# Patient Record
Sex: Female | Born: 1965
Health system: Southern US, Community
[De-identification: ages and names within clinical notes are randomized; demographics above are authoritative.]

## PROBLEM LIST (undated history)

## (undated) DIAGNOSIS — Z8719 Personal history of other diseases of the digestive system: Secondary | ICD-10-CM

## (undated) DIAGNOSIS — T7840XA Allergy, unspecified, initial encounter: Secondary | ICD-10-CM

## (undated) DIAGNOSIS — R51 Headache: Secondary | ICD-10-CM

## (undated) DIAGNOSIS — T4145XA Adverse effect of unspecified anesthetic, initial encounter: Secondary | ICD-10-CM

## (undated) DIAGNOSIS — T753XXA Motion sickness, initial encounter: Secondary | ICD-10-CM

## (undated) DIAGNOSIS — K219 Gastro-esophageal reflux disease without esophagitis: Secondary | ICD-10-CM

## (undated) DIAGNOSIS — J302 Other seasonal allergic rhinitis: Secondary | ICD-10-CM

## (undated) DIAGNOSIS — T8859XA Other complications of anesthesia, initial encounter: Secondary | ICD-10-CM

## (undated) DIAGNOSIS — M1990 Unspecified osteoarthritis, unspecified site: Secondary | ICD-10-CM

## (undated) DIAGNOSIS — Z973 Presence of spectacles and contact lenses: Secondary | ICD-10-CM

## (undated) DIAGNOSIS — R519 Headache, unspecified: Secondary | ICD-10-CM

## (undated) HISTORY — PX: WRIST SURGERY: SHX841

## (undated) HISTORY — PX: HARDWARE REMOVAL: SHX979

## (undated) HISTORY — PX: TONSILLECTOMY: SUR1361

## (undated) HISTORY — PX: TOTAL ABDOMINAL HYSTERECTOMY W/ BILATERAL SALPINGOOPHORECTOMY: SHX83

## (undated) HISTORY — DX: Allergy, unspecified, initial encounter: T78.40XA

## (undated) HISTORY — PX: APPENDECTOMY: SHX54

---
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
## 2000-04-01 HISTORY — PX: WRIST GANGLION EXCISION: SHX840

## 2003-04-02 HISTORY — PX: TOTAL ABDOMINAL HYSTERECTOMY W/ BILATERAL SALPINGOOPHORECTOMY: SHX83

## 2003-04-02 HISTORY — PX: ABDOMINAL HYSTERECTOMY: SHX81

## 2004-04-25 ENCOUNTER — Ambulatory Visit: Payer: Self-pay | Admitting: Obstetrics and Gynecology

## 2004-04-27 ENCOUNTER — Ambulatory Visit: Payer: Self-pay | Admitting: Obstetrics and Gynecology

## 2004-06-05 ENCOUNTER — Ambulatory Visit: Payer: Self-pay | Admitting: Obstetrics and Gynecology

## 2004-06-07 ENCOUNTER — Ambulatory Visit: Payer: Self-pay | Admitting: Obstetrics and Gynecology

## 2004-06-29 ENCOUNTER — Ambulatory Visit: Payer: Self-pay | Admitting: Obstetrics and Gynecology

## 2005-04-01 HISTORY — PX: FRACTURE SURGERY: SHX138

## 2005-07-16 ENCOUNTER — Ambulatory Visit: Payer: Self-pay | Admitting: Obstetrics and Gynecology

## 2005-08-07 ENCOUNTER — Ambulatory Visit: Payer: Self-pay | Admitting: Obstetrics and Gynecology

## 2005-08-30 ENCOUNTER — Ambulatory Visit: Payer: Self-pay | Admitting: Obstetrics and Gynecology

## 2005-11-09 ENCOUNTER — Inpatient Hospital Stay: Payer: Self-pay | Admitting: Specialist

## 2005-11-09 ENCOUNTER — Other Ambulatory Visit: Payer: Self-pay

## 2006-07-30 ENCOUNTER — Ambulatory Visit: Payer: Self-pay | Admitting: Family Medicine

## 2007-10-08 ENCOUNTER — Ambulatory Visit: Payer: Self-pay

## 2007-10-28 ENCOUNTER — Ambulatory Visit: Payer: Self-pay | Admitting: Family Medicine

## 2007-10-28 DIAGNOSIS — J309 Allergic rhinitis, unspecified: Secondary | ICD-10-CM | POA: Insufficient documentation

## 2007-10-28 DIAGNOSIS — Z7289 Other problems related to lifestyle: Secondary | ICD-10-CM | POA: Insufficient documentation

## 2007-10-28 DIAGNOSIS — F4320 Adjustment disorder, unspecified: Secondary | ICD-10-CM | POA: Insufficient documentation

## 2007-10-28 DIAGNOSIS — Z789 Other specified health status: Secondary | ICD-10-CM | POA: Insufficient documentation

## 2009-02-09 ENCOUNTER — Ambulatory Visit: Payer: Self-pay | Admitting: Family Medicine

## 2009-09-26 DIAGNOSIS — R030 Elevated blood-pressure reading, without diagnosis of hypertension: Secondary | ICD-10-CM | POA: Insufficient documentation

## 2010-02-27 ENCOUNTER — Ambulatory Visit: Payer: Self-pay

## 2011-03-28 ENCOUNTER — Ambulatory Visit: Payer: Self-pay | Admitting: General Practice

## 2011-04-01 ENCOUNTER — Ambulatory Visit: Payer: Self-pay | Admitting: General Practice

## 2012-04-14 ENCOUNTER — Ambulatory Visit: Payer: Self-pay | Admitting: Family Medicine

## 2012-09-01 ENCOUNTER — Ambulatory Visit: Payer: Self-pay | Admitting: Internal Medicine

## 2014-01-26 ENCOUNTER — Ambulatory Visit: Payer: Self-pay | Admitting: Family Medicine

## 2014-01-26 LAB — HM PAP SMEAR: HM Pap smear: NEGATIVE

## 2014-01-26 LAB — HM MAMMOGRAPHY

## 2014-11-07 ENCOUNTER — Ambulatory Visit: Payer: Self-pay | Admitting: Podiatry

## 2014-11-14 ENCOUNTER — Other Ambulatory Visit: Payer: Self-pay | Admitting: Family Medicine

## 2014-11-14 NOTE — Telephone Encounter (Signed)
This a pt of Dr. Sharyon Medicus, can you please refill this prescription?  Last ov was on 01/26/2014  Thanks,

## 2014-11-15 ENCOUNTER — Encounter: Payer: Self-pay | Admitting: Podiatry

## 2014-11-15 ENCOUNTER — Ambulatory Visit: Payer: Self-pay

## 2014-11-15 ENCOUNTER — Ambulatory Visit (INDEPENDENT_AMBULATORY_CARE_PROVIDER_SITE_OTHER): Payer: BLUE CROSS/BLUE SHIELD | Admitting: Podiatry

## 2014-11-15 VITALS — BP 126/79 | HR 74 | Resp 18

## 2014-11-15 DIAGNOSIS — M719 Bursopathy, unspecified: Secondary | ICD-10-CM

## 2014-11-15 DIAGNOSIS — M2010 Hallux valgus (acquired), unspecified foot: Secondary | ICD-10-CM | POA: Diagnosis not present

## 2014-11-15 DIAGNOSIS — M722 Plantar fascial fibromatosis: Secondary | ICD-10-CM | POA: Diagnosis not present

## 2014-11-15 DIAGNOSIS — M79673 Pain in unspecified foot: Secondary | ICD-10-CM

## 2014-11-15 NOTE — Progress Notes (Signed)
   Subjective:    Patient ID: Dana Jarvis, female    DOB: 1965/06/24, 49 y.o.   MRN: [id number]  HPI 49 year old female presents the office today with multiple concerns. She states that she has pain to her left foot between her second and third toes with his been ongoing for approximate 2 years. She states that she has majority the pain after prolonged ambulation and weightbearing. Denies any history of injury or trauma. Denies any swelling or redness. No tingling or numbness. She said no prior treatment for this.  Secondly she has concerns of small bunions to both of her feet which is not painful. She's had no treatment for this. She has not noticed them grow recently.  Lastly she has firm masses on the bottom of both of her feet which are not painful and ongoing for several years. The only time she had any discomfort is other prolonged weightbearing or ambulation. Present no prior treatment for this.  No other complaints this time.   Review of Systems  HENT:       Sinus problems   All other systems reviewed and are negative.      Objective:   Physical Exam AAO x3, NAD DP/PT pulses palpable bilaterally, CRT less than 3 seconds Protective sensation intact with Simms Weinstein monofilament, vibratory sensation intact, Achilles tendon reflex intact There is tenderness palpation the second interspace of the left foot between the second and third metatarsals. There is no palpable neuroma identified. There is no area pinpoint bony tenderness or pain the vibratory sensation. There is noted tenderness to the toes. Mild HAV is present bilaterally without any pain over the bunion or pain with MTPJ range of motion. There is no crepitation. No overlying edema, erythema, increase in warmth. There is small firm nodules present within the medial band of the plantar fascia to bilateral feet within the arch of the foot. There is no tenderness palpation overlying the area and there is no overlying skin  change. No other areas of tenderness to bilateral lower extremities. MMT 5/5, ROM WNL.  No open lesions or pre-ulcerative lesions.  No overlying edema, erythema, increase in warmth to bilateral lower extremities.  No pain with calf compression, swelling, warmth, erythema bilaterally.      Assessment & Plan:  49 year old female with bilateral plantar fibromas, is mild HAV both of which are not symptomatic; left second interspace pain. -X-rays were obtained and reviewed with the patient.  -Treatment options discussed including all alternatives, risks, and complications -Discussed both conservative and surgical treatment options for both the bunion in the plantar fibromas. This time she likes to hold off on any treatment and observe the area. I do with it a be good for her to have some kind of orthotic to help of these issues. I discussed this with her. -Discussed likely etiologies the second interspace pain. The bursitis/tendinitis. Neuroma unlikely. I discussed steroid injectionto the interspace on the area of maximal tenderness. I discussed risks and, occasions which understands and consents. Under sterile conditions a total of 2 mL of a mixture of Kenalog 10, 0.5% Marcaine plain and 2% lidocaine plain was infiltrated into the left second interspace but any complications. Post injection care was discussed with the patient. -Follow-up as scheduled or sooner if any problems arise. In the meantime, encouraged to call the office with any questions, concerns, change in symptoms.   Celesta Gentile, DPM

## 2014-12-13 ENCOUNTER — Ambulatory Visit (INDEPENDENT_AMBULATORY_CARE_PROVIDER_SITE_OTHER): Payer: BLUE CROSS/BLUE SHIELD | Admitting: Podiatry

## 2014-12-13 ENCOUNTER — Encounter: Payer: Self-pay | Admitting: Podiatry

## 2014-12-13 VITALS — BP 147/89 | HR 69 | Resp 18

## 2014-12-13 DIAGNOSIS — M719 Bursopathy, unspecified: Secondary | ICD-10-CM | POA: Diagnosis not present

## 2014-12-13 DIAGNOSIS — M2010 Hallux valgus (acquired), unspecified foot: Secondary | ICD-10-CM

## 2014-12-13 DIAGNOSIS — D3610 Benign neoplasm of peripheral nerves and autonomic nervous system, unspecified: Secondary | ICD-10-CM

## 2014-12-14 NOTE — Progress Notes (Signed)
Patient ID: Dana Jarvis, female   DOB: 15-Sep-1965, 49 y.o.   MRN: [id number]  Subjective: 49 year old female presents the office today for follow-up evaluation of left foot second interspace pain. She states the injection did help quite a bit and she has stopped wearing flat shoes which seem to help as well. She has continued some discomfort to the area as well as some intermittent numbness to the toes through the first, second, third toes. Denies any swelling or redness. No other complaints at this time. No acute changes.  Objective: AAO x3, NAD DP/PT pulses palpable bilaterally, CRT less than 3 seconds Protective sensation intact with Simms Weinstein monofilament There is mild discomfort upon palpation to the left second interspace between the metatarsal heads. There is no palpable neuroma identified at this time. There is mild reproduction of symptoms on the medial to lateral compression of the metatarsals. There is no pinpoint bony tenderness or pain the vibratory sensation. No rebound edema, erythema, increase in warmth. No change in the plantar soft tissue masses. Mild HAV. No other areas of tenderness to bilateral lower extremities. MMT 5/5, ROM WNL.  No open lesions or pre-ulcerative lesions.  No overlying edema, erythema, increase in warmth to bilateral lower extremities.  No pain with calf compression, swelling, warmth, erythema bilaterally.   Assessment: 49 year old female left second interspace pain, bursitis versus possible neuroma  Plan: -Treatment options discussed including all alternatives, risks, and complications -Discussed steroid injection has not seem to help his symptoms persist somewhat. Risks and complications discussed for which she understands verbally consents. Under sterile conditions a total of 1.5 mL of a mixture of Kenalog 10, 0.5% Marcaine plain and 2% lidocaine plain was infiltrated into the area of maximal tenderness in the left second interspace without  competitions. She tolerated this well without competitions. Postinjection care was discussed. -Dispensed metatarsal offloading pads. -Discussed orthotics. -Follow-up in 6 weeks if symptoms continue or sooner if any problems arise. In the meantime, encouraged to call the office with any questions, concerns, change in symptoms.   Celesta Gentile, DPM

## 2015-01-16 ENCOUNTER — Other Ambulatory Visit: Payer: Self-pay | Admitting: Family Medicine

## 2015-01-16 DIAGNOSIS — F4322 Adjustment disorder with anxiety: Secondary | ICD-10-CM

## 2015-01-17 DIAGNOSIS — E0590 Thyrotoxicosis, unspecified without thyrotoxic crisis or storm: Secondary | ICD-10-CM | POA: Insufficient documentation

## 2015-01-17 DIAGNOSIS — R0683 Snoring: Secondary | ICD-10-CM | POA: Insufficient documentation

## 2015-01-20 ENCOUNTER — Encounter: Payer: Self-pay | Admitting: Family Medicine

## 2015-01-24 ENCOUNTER — Ambulatory Visit: Payer: BLUE CROSS/BLUE SHIELD | Admitting: Podiatry

## 2015-04-05 DIAGNOSIS — M1990 Unspecified osteoarthritis, unspecified site: Secondary | ICD-10-CM | POA: Insufficient documentation

## 2015-04-05 DIAGNOSIS — G4700 Insomnia, unspecified: Secondary | ICD-10-CM | POA: Insufficient documentation

## 2015-04-05 DIAGNOSIS — E669 Obesity, unspecified: Secondary | ICD-10-CM | POA: Insufficient documentation

## 2015-04-05 DIAGNOSIS — R319 Hematuria, unspecified: Secondary | ICD-10-CM | POA: Insufficient documentation

## 2015-04-05 DIAGNOSIS — R9431 Abnormal electrocardiogram [ECG] [EKG]: Secondary | ICD-10-CM | POA: Insufficient documentation

## 2015-04-05 DIAGNOSIS — K219 Gastro-esophageal reflux disease without esophagitis: Secondary | ICD-10-CM | POA: Insufficient documentation

## 2015-04-06 ENCOUNTER — Encounter: Payer: Self-pay | Admitting: Family Medicine

## 2015-04-06 ENCOUNTER — Ambulatory Visit (INDEPENDENT_AMBULATORY_CARE_PROVIDER_SITE_OTHER): Payer: BLUE CROSS/BLUE SHIELD | Admitting: Family Medicine

## 2015-04-06 VITALS — BP 132/84 | HR 74 | Temp 98.1°F | Resp 16 | Ht 62.0 in | Wt 204.0 lb

## 2015-04-06 DIAGNOSIS — Z1211 Encounter for screening for malignant neoplasm of colon: Secondary | ICD-10-CM

## 2015-04-06 DIAGNOSIS — Z0000 Encounter for general adult medical examination without abnormal findings: Secondary | ICD-10-CM | POA: Diagnosis not present

## 2015-04-06 DIAGNOSIS — Z1239 Encounter for other screening for malignant neoplasm of breast: Secondary | ICD-10-CM

## 2015-04-06 LAB — POCT URINALYSIS DIPSTICK
Bilirubin, UA: NEGATIVE
Blood, UA: NEGATIVE
Glucose, UA: NEGATIVE
Ketones, UA: NEGATIVE
Leukocytes, UA: NEGATIVE
Nitrite, UA: NEGATIVE
Protein, UA: NEGATIVE
Spec Grav, UA: 1.01
Urobilinogen, UA: 0.2
pH, UA: 6

## 2015-04-06 NOTE — Progress Notes (Signed)
Patient ID: ERIOLUWA VENTURINO, female   DOB: 1965-08-05, 50 y.o.   MRN: [id number]       Patient: Dana Jarvis, Female    DOB: [birth year], 50 y.o.   MRN: [id number] Visit Date: 04/06/2015  Today's Provider: Margarita Rana, MD   Chief Complaint  Patient presents with  . Annual Exam   Subjective:    Annual physical exam Dana Jarvis is a 50 y.o. female who presents today for health maintenance and complete physical. She feels well. She reports exercising none. She reports she is sleeping well.  01/26/14 CPE 01/26/14 Pap-neg; HPV-neg 01/26/14 Mammo-BI-RADS 1  Results for orders placed or performed in visit on 04/06/15  POCT urinalysis dipstick  Result Value Ref Range   Color, UA yellow    Clarity, UA clear    Glucose, UA neg    Bilirubin, UA neg    Ketones, UA neg    Spec Grav, UA 1.010    Blood, UA neg    pH, UA 6.0    Protein, UA neg    Urobilinogen, UA 0.2    Nitrite, UA neg    Leukocytes, UA Negative Negative    -----------------------------------------------------------------   Review of Systems  Constitutional: Negative.   HENT: Negative.   Eyes: Negative.   Respiratory: Negative.   Cardiovascular: Negative.   Gastrointestinal: Negative.   Endocrine: Negative.   Genitourinary: Negative.   Musculoskeletal: Negative.   Skin: Negative.   Allergic/Immunologic: Positive for environmental allergies.  Neurological: Negative.   Hematological: Negative.   Psychiatric/Behavioral: Negative.     Social History      She  reports that she has never smoked. She has never used smokeless tobacco. She reports that she drinks alcohol. She reports that she does not use illicit drugs.       Social History   Social History  . Marital Status: Widowed    Spouse Name: N/A  . Number of Children: N/A  . Years of Education: N/A   Social History Main Topics  . Smoking status: Never Smoker   . Smokeless tobacco: Never Used  . Alcohol Use: 0.0 oz/week    0 Standard drinks  or equivalent per week     Comment: occasional  . Drug Use: No  . Sexual Activity: Not Asked   Other Topics Concern  . None   Social History Narrative    History reviewed. No pertinent past medical history.   Patient Active Problem List   Diagnosis Date Noted  . Arthritis 04/05/2015  . Acid reflux 04/05/2015  . Blood in the urine 04/05/2015  . Cannot sleep 04/05/2015  . Adiposity 04/05/2015  . Shortened PR interval 04/05/2015  . Hyperthyroidism, subclinical 01/17/2015  . Snores 01/17/2015  . Blood pressure elevated without history of HTN 09/26/2009  . Adaptation reaction 10/28/2007  . Alcohol drinker ([place]) 10/28/2007  . Allergic rhinitis 10/28/2007    Past Surgical History  Procedure Laterality Date  . Wrist surgery Left     ganglion  . Tonsillectomy    . Fracture surgery  2007  . Abdominal hysterectomy  2005    with bilateral salpingo-oophorectomy  . Cesarean section  72    Family History        Family Status  Relation Status Death Age  . Mother Deceased 29  . Father Deceased 22  . Brother Alive   . Sister Alive         Her family history includes Arthritis in her mother; Cancer in her  father and mother; Glaucoma in her mother; Healthy in her sister; Hypertension in her brother.    Allergies  Allergen Reactions  . Latex   . Sulfa Antibiotics     Previous Medications   CETIRIZINE HCL (ZYRTEC ALLERGY) 10 MG CAPS    Take by mouth.   CHOLECALCIFEROL (D3 DOTS) 2000 UNITS TBDP    Take by mouth.   ESCITALOPRAM (LEXAPRO) 10 MG TABLET    TAKE ONE TABLET EVERY DAY   FLUTICASONE (FLONASE) 50 MCG/ACT NASAL SPRAY    Place 2 sprays into both nostrils as needed.     Patient Care Team: Margarita Rana, MD as PCP - General (Family Medicine)     Objective:   Vitals: BP 132/84 mmHg  Pulse 74  Temp(Src) 98.1 F (36.7 C) (Oral)  Resp 16  Wt 204 lb (92.534 kg)  SpO2 97%  LMP  (LMP Unknown)   Physical Exam  Constitutional: She is oriented to person, place,  and time. She appears well-developed and well-nourished.  HENT:  Head: Normocephalic and atraumatic.  Right Ear: Tympanic membrane, external ear and ear canal normal.  Left Ear: Tympanic membrane, external ear and ear canal normal.  Nose: Nose normal.  Mouth/Throat: Uvula is midline, oropharynx is clear and moist and mucous membranes are normal.  Eyes: Conjunctivae, EOM and lids are normal. Pupils are equal, round, and reactive to light.  Neck: Trachea normal and normal range of motion. Neck supple. Carotid bruit is not present. No thyroid mass and no thyromegaly present.  Cardiovascular: Normal rate, regular rhythm and normal heart sounds.   Pulmonary/Chest: Effort normal and breath sounds normal.  Abdominal: Soft. Normal appearance and bowel sounds are normal. There is no hepatosplenomegaly. There is no tenderness.  Genitourinary: No breast swelling, tenderness or discharge.  Musculoskeletal: Normal range of motion.  Lymphadenopathy:    She has no cervical adenopathy.    She has no axillary adenopathy.  Neurological: She is alert and oriented to person, place, and time. She has normal strength. No cranial nerve deficit.  Skin: Skin is warm, dry and intact.  Psychiatric: She has a normal mood and affect. Her speech is normal and behavior is normal. Judgment and thought content normal. Cognition and memory are normal.     Depression Screen PHQ 2/9 Scores 04/06/2015  PHQ - 2 Score 0      Assessment & Plan:     Routine Health Maintenance and Physical Exam  Exercise Activities and Dietary recommendations Goals    . Exercise 150 minutes per week (moderate activity)       1. Annual physical exam As above.   Stressed importance of eating healthy and exercise.  Recheck in 4 months.  - POCT urinalysis dipstick  2. Breast cancer screening Patient will schedule.   - MM DIGITAL SCREENING BILATERAL; Future  3. Colon cancer screening Will refer for colonoscopy.   - Ambulatory  referral to Gastroenterology   Patient was seen and examined by Jerrell Belfast, MD, and note scribed by Lynford Humphrey, [place].   I have reviewed the document for accuracy and completeness and I agree with above. Jerrell Belfast, MD   Margarita Rana, MD      --------------------------------------------------------------------

## 2015-04-07 ENCOUNTER — Other Ambulatory Visit: Payer: Self-pay

## 2015-04-07 ENCOUNTER — Telehealth: Payer: Self-pay

## 2015-04-07 ENCOUNTER — Telehealth: Payer: Self-pay | Admitting: Family Medicine

## 2015-04-07 NOTE — Telephone Encounter (Signed)
Pt called saying she was in yesterday for a CPE.  She works for [employer].  She thought she was suppose to get a lab order to have labs done there at [facility] but she did not get anything.  Please call her back and let her know what the plans are for labs.  Her call back is  [phone]

## 2015-04-07 NOTE — Telephone Encounter (Signed)
Labs normal last year. Do not routinely check yearly, but can write out rx for labs if patient would like or has a particular concern. Thanks.

## 2015-04-07 NOTE — Telephone Encounter (Signed)
Gastroenterology Pre-Procedure Review  Request Date: 04/28/15 Requesting Physician: Dr. Venia Minks  PATIENT REVIEW QUESTIONS: The patient responded to the following health history questions as indicated:    1. Are you having any GI issues? no 2. Do you have a personal history of Polyps? no 3. Do you have a family history of Colon Cancer or Polyps? no 4. Diabetes Mellitus? no 5. Joint replacements in the past 12 months?no 6. Major health problems in the past 3 months?no 7. Any artificial heart valves, MVP, or defibrillator?no    MEDICATIONS & ALLERGIES:    Patient reports the following regarding taking any anticoagulation/antiplatelet therapy:   Plavix, Coumadin, Eliquis, Xarelto, Lovenox, Pradaxa, Brilinta, or Effient? no Aspirin? no  Patient confirms/reports the following medications:  Current Outpatient Prescriptions  Medication Sig Dispense Refill  . Cetirizine HCl (ZYRTEC ALLERGY) 10 MG CAPS Take by mouth.    . Cholecalciferol (D3 DOTS) 2000 UNITS TBDP Take by mouth.    . escitalopram (LEXAPRO) 10 MG tablet TAKE ONE TABLET EVERY DAY 30 tablet 5  . fluticasone (FLONASE) 50 MCG/ACT nasal spray Place 2 sprays into both nostrils as needed.   2   No current facility-administered medications for this visit.    Patient confirms/reports the following allergies:  Allergies  Allergen Reactions  . Latex   . Sulfa Antibiotics     No orders of the defined types were placed in this encounter.    AUTHORIZATION INFORMATION Primary Insurance: 1D#: Group #:  Secondary Insurance: 1D#: Group #:  SCHEDULE INFORMATION: Date: 04/28/15 Time: Location: [place]

## 2015-04-07 NOTE — Telephone Encounter (Signed)
Pt advised as directed below.  She does not want to get her labs done at this time.    Thanks,   -Mickel Baas

## 2015-04-19 ENCOUNTER — Encounter: Payer: Self-pay | Admitting: *Deleted

## 2015-04-19 ENCOUNTER — Encounter: Payer: Self-pay | Admitting: Anesthesiology

## 2015-04-28 ENCOUNTER — Ambulatory Visit
Admission: RE | Admit: 2015-04-28 | Payer: BLUE CROSS/BLUE SHIELD | Source: Ambulatory Visit | Admitting: Gastroenterology

## 2015-04-28 ENCOUNTER — Encounter: Admission: RE | Payer: Self-pay | Source: Ambulatory Visit

## 2015-04-28 HISTORY — DX: Other complications of anesthesia, initial encounter: T88.59XA

## 2015-04-28 HISTORY — DX: Unspecified osteoarthritis, unspecified site: M19.90

## 2015-04-28 HISTORY — DX: Adverse effect of unspecified anesthetic, initial encounter: T41.45XA

## 2015-04-28 HISTORY — DX: Presence of spectacles and contact lenses: Z97.3

## 2015-04-28 HISTORY — DX: Gastro-esophageal reflux disease without esophagitis: K21.9

## 2015-04-28 HISTORY — DX: Other seasonal allergic rhinitis: J30.2

## 2015-04-28 HISTORY — DX: Motion sickness, initial encounter: T75.3XXA

## 2015-04-28 HISTORY — DX: Headache: R51

## 2015-04-28 HISTORY — DX: Headache, unspecified: R51.9

## 2015-04-28 SURGERY — COLONOSCOPY WITH PROPOFOL
Anesthesia: Choice

## 2015-08-11 ENCOUNTER — Ambulatory Visit: Payer: BLUE CROSS/BLUE SHIELD | Admitting: Family Medicine

## 2015-08-30 ENCOUNTER — Other Ambulatory Visit: Payer: Self-pay | Admitting: Family Medicine

## 2015-08-30 DIAGNOSIS — F4329 Adjustment disorder with other symptoms: Secondary | ICD-10-CM

## 2015-08-30 NOTE — Telephone Encounter (Signed)
Pt contacted office for refill request on the following medications:  escitalopram (LEXAPRO) 10 MG tablet. [place].  CB#[phone]/MW   Pt is completely out.

## 2015-11-10 ENCOUNTER — Ambulatory Visit: Payer: BLUE CROSS/BLUE SHIELD | Admitting: Physician Assistant

## 2015-11-13 ENCOUNTER — Other Ambulatory Visit: Payer: Self-pay | Admitting: Family Medicine

## 2015-11-13 DIAGNOSIS — F4329 Adjustment disorder with other symptoms: Secondary | ICD-10-CM

## 2016-02-08 ENCOUNTER — Ambulatory Visit
Admission: RE | Admit: 2016-02-08 | Discharge: 2016-02-08 | Disposition: A | Discharge status: Home / Self Care | Payer: BLUE CROSS/BLUE SHIELD | Source: Ambulatory Visit | Attending: Medical | Admitting: Medical

## 2016-02-08 ENCOUNTER — Other Ambulatory Visit: Payer: Self-pay | Admitting: Medical

## 2016-02-08 DIAGNOSIS — M7731 Calcaneal spur, right foot: Secondary | ICD-10-CM | POA: Diagnosis not present

## 2016-02-08 DIAGNOSIS — M2141 Flat foot [pes planus] (acquired), right foot: Secondary | ICD-10-CM | POA: Diagnosis not present

## 2016-02-08 DIAGNOSIS — Z9889 Other specified postprocedural states: Secondary | ICD-10-CM | POA: Insufficient documentation

## 2016-02-08 DIAGNOSIS — M25571 Pain in right ankle and joints of right foot: Secondary | ICD-10-CM

## 2016-02-28 ENCOUNTER — Ambulatory Visit (INDEPENDENT_AMBULATORY_CARE_PROVIDER_SITE_OTHER): Payer: BLUE CROSS/BLUE SHIELD | Admitting: Podiatry

## 2016-02-28 ENCOUNTER — Encounter: Payer: Self-pay | Admitting: Podiatry

## 2016-02-28 ENCOUNTER — Ambulatory Visit (INDEPENDENT_AMBULATORY_CARE_PROVIDER_SITE_OTHER): Payer: BLUE CROSS/BLUE SHIELD

## 2016-02-28 DIAGNOSIS — S92214A Nondisplaced fracture of cuboid bone of right foot, initial encounter for closed fracture: Secondary | ICD-10-CM | POA: Diagnosis not present

## 2016-02-28 DIAGNOSIS — M79671 Pain in right foot: Secondary | ICD-10-CM | POA: Diagnosis not present

## 2016-02-28 DIAGNOSIS — M778 Other enthesopathies, not elsewhere classified: Secondary | ICD-10-CM

## 2016-02-28 DIAGNOSIS — M779 Enthesopathy, unspecified: Secondary | ICD-10-CM

## 2016-02-28 DIAGNOSIS — M7751 Other enthesopathy of right foot: Secondary | ICD-10-CM

## 2016-02-28 NOTE — Progress Notes (Signed)
She presents today with a chief complaint of pain to the lateral and dorsal lateral aspect of the right foot. So status started about a month ago after a 5K run. She saw her primary doctor who did an x-ray and states that the ankle looked fine. She has a history of previous fracture to the right ankle with internal fixation.  Objective: Vital signs stable alert and oriented 3. Pulses are palpable no edema about the ankle she does have tenderness overlying the cuboid. Radiographs taken in the office today 3 views of the right foot demonstrate what appears to be a fracture of the cuboid bone one view demonstrates it as an intra-articular fracture at the base of the fifth and fourth metatarsals.  Assessment fracture cuboid right.  Plan: Place her in a Cam [place] Rile will follow up with her in 1 month no set of x-rays will be necessary at that time.

## 2016-03-27 ENCOUNTER — Ambulatory Visit (INDEPENDENT_AMBULATORY_CARE_PROVIDER_SITE_OTHER): Payer: BLUE CROSS/BLUE SHIELD | Admitting: Podiatry

## 2016-03-27 ENCOUNTER — Encounter: Payer: Self-pay | Admitting: Podiatry

## 2016-03-27 DIAGNOSIS — S92214A Nondisplaced fracture of cuboid bone of right foot, initial encounter for closed fracture: Secondary | ICD-10-CM

## 2016-03-27 DIAGNOSIS — L603 Nail dystrophy: Secondary | ICD-10-CM

## 2016-03-27 DIAGNOSIS — B351 Tinea unguium: Secondary | ICD-10-CM | POA: Diagnosis not present

## 2016-03-27 NOTE — Progress Notes (Signed)
She presents today for follow-up of her cuboid fracture right foot. She states this seems to be doing a lot better though she still has some tenderness across the dorsal lateral aspect of the foot. She is also concerned about the discoloration of her hallux nail and her second digital nail plate left foot.  Objective: Vital signs are stable to alert and oriented 3 much decrease in edema from previous visit no ecchymosis noted drainage no lesions. She has no pain of significance on palpation of the cuboid or impingement of the cuboid with the calcaneus and fifth metatarsal base. She does have thickening of the toenail plate second digit left and hallux right.  Assessment: Well-healing fracture cuboid right foot. Nail dystrophy onychomycosis second digit left and hallux right.  Plan: The nails are taken today with skin and sent for pathologic evaluation I will notify her of the results. She will follow up with me in 1 month. I instructed her to continue to wear the Cam Walker for another 2 weeks. He

## 2016-04-01 HISTORY — PX: HARDWARE REMOVAL: SHX979

## 2016-04-29 ENCOUNTER — Ambulatory Visit (INDEPENDENT_AMBULATORY_CARE_PROVIDER_SITE_OTHER): Payer: BLUE CROSS/BLUE SHIELD | Admitting: Podiatry

## 2016-04-29 ENCOUNTER — Ambulatory Visit (INDEPENDENT_AMBULATORY_CARE_PROVIDER_SITE_OTHER): Payer: BLUE CROSS/BLUE SHIELD

## 2016-04-29 DIAGNOSIS — M7751 Other enthesopathy of right foot: Secondary | ICD-10-CM | POA: Diagnosis not present

## 2016-04-29 DIAGNOSIS — L603 Nail dystrophy: Secondary | ICD-10-CM

## 2016-04-29 DIAGNOSIS — S92214A Nondisplaced fracture of cuboid bone of right foot, initial encounter for closed fracture: Secondary | ICD-10-CM

## 2016-04-29 MED ORDER — TERBINAFINE HCL 250 MG PO TABS: 250.0000 mg | ORAL_TABLET | Freq: Every day | ORAL | 0 refills | Status: DC

## 2016-04-29 NOTE — Progress Notes (Signed)
Dana Jarvis presents today for follow-up of her pain to the dorsal aspect and lateral aspect of the right foot. States the cuboid feels much better but am having pain right at a sports in the dorsal aspect of the right foot.  Objective: Vital signs are stable alert and oriented 3. Pathology report did come back positive for fungus. She also has pain on palpation dorsal aspect of the right foot overlying the talar neck. Radiographs do not demonstrate any type of osseus abnormalities in this area from previous radiographs.  Assessment: Onychomycosis and capsulitis.  Plan: Started her on Lamisil 250 mg tablets after a liver profile. This 30 tablets. Will follow up with her in 1 month. Injected the dorsal aspect of the right foot today.

## 2016-05-10 ENCOUNTER — Telehealth: Payer: Self-pay | Admitting: *Deleted

## 2016-05-10 NOTE — Telephone Encounter (Addendum)
-----   Message from Garrel Ridgel, [place] sent at 05/08/2016  3:30 PM EST ----- Blood work is good. 05/10/2016--Left Message informing pt of results and that she could begin her medication as directed.

## 2016-05-27 ENCOUNTER — Ambulatory Visit: Payer: BLUE CROSS/BLUE SHIELD

## 2016-05-27 ENCOUNTER — Ambulatory Visit (INDEPENDENT_AMBULATORY_CARE_PROVIDER_SITE_OTHER): Payer: BLUE CROSS/BLUE SHIELD

## 2016-05-27 ENCOUNTER — Ambulatory Visit (INDEPENDENT_AMBULATORY_CARE_PROVIDER_SITE_OTHER): Payer: BLUE CROSS/BLUE SHIELD | Admitting: Podiatry

## 2016-05-27 DIAGNOSIS — Z79899 Other long term (current) drug therapy: Secondary | ICD-10-CM

## 2016-05-27 DIAGNOSIS — S92214A Nondisplaced fracture of cuboid bone of right foot, initial encounter for closed fracture: Secondary | ICD-10-CM

## 2016-05-27 MED ORDER — TERBINAFINE HCL 250 MG PO TABS: 250.0000 mg | ORAL_TABLET | Freq: Every day | ORAL | 0 refills | Status: DC

## 2016-05-27 NOTE — Progress Notes (Signed)
She presents today for follow-up of her right foot and ankle as well as the Lamisil therapy for her onychomycosis. She denies fever chills nausea vomiting muscle aches pains rashes or itching with the use of the Lamisil. She does relate pain from the ankle radiating onto the anterior foot. She feels that it is associated with her ankle fracture from several years ago and the internal fixation. She also is concerned whether he may be associated with her bunion deformity or not.  Objective: Vital signs are stable alert and oriented 3. Pulses are palpable. Neurologic sensorium is intact deep tendon reflexes are intact. She has tenderness on palpation of the anterolateral ankle with very prominent internal fixation. She has fluid collection in the anterior ankle visible not only on radiograph without any other signs of arthritis but also on physical exam. Toenails have that changed yet.  Assessment: Long-term therapy associated with onychomycosis and terbinafine use. She also has capsulitis of the anterior ankle. Painful internal fixation.  Plan: I injected the ankle today after sterile Betadine skin prep. With Kenalog and local anesthetic set I will follow-up with her in 3 months. I also started her on her 90 day dose of Lamisil and rotator prescription for a liver profile. Should this come back abnormal I will notify her otherwise I will see her in 3 months. She will call sooner if needed regarding not only the anterior ankle and painful internal fixation but the bunion on the right foot which needs repair. She is interested in surgery in the near future. I requested surgical notes regarding the right ankle so that we could remove the internal fixation.

## 2016-06-27 ENCOUNTER — Other Ambulatory Visit: Payer: BLUE CROSS/BLUE SHIELD

## 2016-06-28 ENCOUNTER — Other Ambulatory Visit: Payer: BLUE CROSS/BLUE SHIELD

## 2016-06-28 DIAGNOSIS — Z79899 Other long term (current) drug therapy: Secondary | ICD-10-CM

## 2016-06-29 LAB — HEPATIC FUNCTION PANEL
ALT: 15 IU/L (ref 0–32)
AST: 23 IU/L (ref 0–40)
Albumin: 4.4 g/dL (ref 3.5–5.5)
Alkaline Phosphatase: 94 IU/L (ref 39–117)
Bilirubin Total: 0.4 mg/dL (ref 0.0–1.2)
Bilirubin, Direct: 0.11 mg/dL (ref 0.00–0.40)
Total Protein: 6.7 g/dL (ref 6.0–8.5)

## 2016-07-03 ENCOUNTER — Telehealth: Payer: Self-pay | Admitting: *Deleted

## 2016-07-03 NOTE — Telephone Encounter (Addendum)
-----   Message from Garrel Ridgel, [place] sent at 07/03/2016 11:53 AM EDT ----- Labs WNL. Pt can continue Lamisil. Informed pt of Dr. Stephenie Acres orders.

## 2016-07-10 ENCOUNTER — Encounter: Payer: Self-pay | Admitting: Medical

## 2016-07-10 ENCOUNTER — Ambulatory Visit: Payer: BLUE CROSS/BLUE SHIELD | Admitting: Medical

## 2016-07-10 VITALS — BP 130/80 | HR 70 | Temp 97.7°F | Resp 16 | Ht 62.0 in | Wt 183.0 lb

## 2016-07-10 DIAGNOSIS — J0100 Acute maxillary sinusitis, unspecified: Secondary | ICD-10-CM

## 2016-07-10 DIAGNOSIS — J301 Allergic rhinitis due to pollen: Secondary | ICD-10-CM

## 2016-07-10 MED ORDER — FLUTICASONE PROPIONATE 50 MCG/ACT NA SUSP: 2.0000 | Freq: Every day | NASAL | 6 refills | Status: DC

## 2016-07-10 MED ORDER — AMOXICILLIN 875 MG PO TABS: 875.0000 mg | ORAL_TABLET | Freq: Two times a day (BID) | ORAL | 0 refills | Status: AC

## 2016-07-10 NOTE — Progress Notes (Signed)
   Subjective:    Patient ID: Dana Jarvis, female    DOB: 08-10-1965, 51 y.o.   MRN: [id number]  HPI 51 yo  Started with sore throat 10 days ago, then  2 days ago thick green mucus collecting in her throat. Starting with right ear pain on and off, began today. Mild cough. Throat better through out the day but worse at night when lying down. Nasal congestion and pressure on the right side of frontal and maxillary sinuses.   Review of Systems  Constitutional: Positive for chills. Negative for fever.  HENT: Positive for congestion, ear pain, postnasal drip, rhinorrhea, sinus pain, sinus pressure, sneezing, sore throat and voice change. Negative for trouble swallowing.   Eyes: Positive for discharge.  Respiratory: Positive for cough. Negative for shortness of breath and wheezing.   Cardiovascular: Negative for chest pain.  Allergic/Immunologic: Positive for environmental allergies. Negative for food allergies.   Discharge in right eye this am only. Not itchy none today.    Objective:   Physical Exam  Constitutional: She is oriented to person, place, and time. She appears well-developed and well-nourished.  HENT:  Head: Normocephalic and atraumatic.  Right Ear: External ear normal.  Left Ear: External ear normal.  Eyes: Conjunctivae and EOM are normal. Pupils are equal, round, and reactive to light.  Neck: Normal range of motion. Neck supple.  Cardiovascular: Normal rate, regular rhythm and normal heart sounds.  Exam reveals no gallop and no friction rub.   No murmur heard. Pulmonary/Chest: Effort normal and breath sounds normal.  Musculoskeletal: Normal range of motion.  Lymphadenopathy:    She has no cervical adenopathy.  Neurological: She is alert and oriented to person, place, and time.  Skin: Skin is warm and dry.  Psychiatric: She has a normal mood and affect. Her behavior is normal.  Nursing note and vitals reviewed.  Nares red and swollen.  Right ear  TM bulging fluid  noted. Pressure with palpation of right sinuses frontal and maxillary, not painful.     Assessment & Plan:  Sinusitis prescribed Amoxil 875 mg one tablet by mouth twiced daily x 10 days #20 no refills Allergic rhinitis flonase nasal spray  2 sprays each nare once daily . #1 6 refills Continue zyrtec take as directed. Take motrin as needed take as directed. Return to the clinic in  3-5 days if not improving.

## 2016-07-18 ENCOUNTER — Ambulatory Visit: Payer: BLUE CROSS/BLUE SHIELD | Admitting: Medical

## 2016-07-18 VITALS — BP 120/84 | HR 89 | Temp 100.0°F | Resp 16

## 2016-07-18 DIAGNOSIS — J111 Influenza due to unidentified influenza virus with other respiratory manifestations: Secondary | ICD-10-CM

## 2016-07-18 NOTE — Progress Notes (Signed)
   Subjective:    Patient ID: Dana Jarvis, female    DOB: 01-20-1966, 51 y.o.   MRN: [id number]  HPI 51 yo female started feeling bad on Wednesday night , feeling fatigue, dry/scratchy throat, bad headache, back aching , legs aching , feeling weak. Started today with fever. Ibuprofen at  11am two 200mg  tablets.Some nausea, no appetite,  Started amoxil last night. Tired feeling.  Seen 4/11 /18 for  Seasonal allergies and rhinnitis, given Amoxil in case of color change of nasal mucus. 2 days later was feeling better.  So did not start antibiotics. Felt so bad last night that she decided to start her antibiotics. Took a dose last night and today.    Review of Systems  Constitutional: Positive for appetite change, fatigue and fever. Negative for chills.  HENT: Positive for congestion, postnasal drip, rhinorrhea, sinus pressure, sneezing and sore throat. Negative for ear pain.   Eyes: Negative for discharge and itching.  Respiratory: Positive for cough. Negative for chest tightness and shortness of breath.   Cardiovascular: Positive for chest pain and palpitations. Negative for leg swelling.  Gastrointestinal: Positive for nausea. Negative for constipation, diarrhea and vomiting.  Endocrine: Negative for cold intolerance and heat intolerance.  Genitourinary: Negative for dysuria and hematuria.  Musculoskeletal: Positive for back pain. Negative for arthralgias and neck pain.  Allergic/Immunologic: Positive for environmental allergies. Negative for food allergies.       Objective:   Physical Exam  Constitutional: She is oriented to person, place, and time. She appears well-developed and well-nourished.  HENT:  Head: Normocephalic and atraumatic.  Right Ear: Hearing, tympanic membrane, external ear and ear canal normal.  Left Ear: Hearing, tympanic membrane, external ear and ear canal normal.  Nose: Mucosal edema and rhinorrhea present.  Mouth/Throat: Uvula is midline and mucous membranes  are normal. Uvula swelling present. Posterior oropharyngeal erythema present.  Eyes: EOM are normal.  Neck: Normal range of motion. Neck supple.  Cardiovascular: Normal rate, regular rhythm and normal heart sounds.  Exam reveals no gallop and no friction rub.   No murmur heard. Pulmonary/Chest: Effort normal and breath sounds normal.  Musculoskeletal: Normal range of motion.  Lymphadenopathy:    She has cervical adenopathy.  Neurological: She is alert and oriented to person, place, and time.  Skin: Skin is warm. She is diaphoretic.  Psychiatric: She has a normal mood and affect. Her behavior is normal. Judgment and thought content normal.  Nursing note and vitals reviewed.         Assessment & Plan:  Influenza, patient does not want tamiflu .  Rest, increase fluids, otc motrin or tylenol take as directed for fever or if not feeling well. Given Ginger Ale and one Tylenol extra strength [id number]  Exp 4/21 Stop Amoxil. Return to the clinic as needed. Must be fever free x 48 hours without motrin or tylenol before returning to work.

## 2016-07-18 NOTE — Progress Notes (Signed)
Patient was seen in clinic last Wednesday dx with sinusitis and rx amoxicillin.  She did not take the medication bc she started feeling better.  Then yesterday she began feeling really bad - weak, productive cough, Head ache, fever/chills etc.  So she started the abx last night and took another dose this morning.  She took 2 tylenol around 10 am today.

## 2016-08-28 ENCOUNTER — Encounter (INDEPENDENT_AMBULATORY_CARE_PROVIDER_SITE_OTHER): Payer: BLUE CROSS/BLUE SHIELD | Admitting: Podiatry

## 2016-08-28 NOTE — Progress Notes (Signed)
This encounter was created in error - please disregard.

## 2016-10-01 ENCOUNTER — Other Ambulatory Visit: Payer: Self-pay | Admitting: Physician Assistant

## 2016-10-01 DIAGNOSIS — F4329 Adjustment disorder with other symptoms: Secondary | ICD-10-CM

## 2016-10-14 ENCOUNTER — Ambulatory Visit (INDEPENDENT_AMBULATORY_CARE_PROVIDER_SITE_OTHER): Payer: BLUE CROSS/BLUE SHIELD | Admitting: Podiatry

## 2016-10-14 DIAGNOSIS — L603 Nail dystrophy: Secondary | ICD-10-CM | POA: Diagnosis not present

## 2016-10-14 MED ORDER — TERBINAFINE HCL 250 MG PO TABS: 250.0000 mg | ORAL_TABLET | Freq: Every day | ORAL | 0 refills | Status: DC

## 2016-10-14 NOTE — Progress Notes (Signed)
She presents today for follow-up of her onychomycosis and has pleaded for 120 today dosing. She states that they're doing great improving considerably.  Objective: Vital signs stable alert and oriented 3. Pulses are palpable. Nail plates appear to be resolving at least 75%.  Assessment: Well healing onychomycosis with the use of Lamisil therapy.  Plan: I encouraged her to use Lamisil every other day for the next 2 months and I will follow-up with her in 3 months. A prescription was sent to her pharmacy today.

## 2016-11-18 ENCOUNTER — Other Ambulatory Visit: Payer: Self-pay | Admitting: Medical

## 2016-11-18 DIAGNOSIS — Z1231 Encounter for screening mammogram for malignant neoplasm of breast: Secondary | ICD-10-CM

## 2016-11-27 ENCOUNTER — Ambulatory Visit: Payer: BLUE CROSS/BLUE SHIELD | Admitting: Adult Health

## 2016-11-27 ENCOUNTER — Encounter: Payer: Self-pay | Admitting: Adult Health

## 2016-11-27 VITALS — BP 128/80 | HR 88 | Temp 98.3°F | Wt 169.6 lb

## 2016-11-27 DIAGNOSIS — J309 Allergic rhinitis, unspecified: Secondary | ICD-10-CM

## 2016-11-27 DIAGNOSIS — J329 Chronic sinusitis, unspecified: Secondary | ICD-10-CM

## 2016-11-27 MED ORDER — AMOXICILLIN-POT CLAVULANATE 875-125 MG PO TABS: 1.0000 | ORAL_TABLET | Freq: Two times a day (BID) | ORAL | 0 refills | Status: DC

## 2016-11-27 NOTE — Progress Notes (Signed)
Subjective:     Patient ID: Dana Jarvis, female   DOB: 08-Nov-1965, 51 y.o.   MRN: [id number]  HPI Patient is a 51 year old in clinic in no acute distress who presents with sneezing, head congestion, cough. Productive cough in the morning with green- yellow sputum.She started with a sorethroat for three days that has resolved. Started 11/24/16.  She has missed her Zyrtec for the past month.  She has been taking Claritin D the last three days.  She has taken Ibuprofen 600 mg occasionally the past three days.   Blood pressure 128/80, pulse 88, temperature 98.3 F (36.8 C), temperature source Tympanic, weight 169 lb 9.6 oz (76.9 kg), SpO2 99 %.  Hysterectomy 13 years ago.   She denies any other symptoms. Allergies  Allergen Reactions  . Latex Rash  . Sulfa Antibiotics Rash      Current Outpatient Prescriptions:  .  Cetirizine HCl (ZYRTEC ALLERGY) 10 MG CAPS, Take by mouth., Disp: , Rfl:  .  escitalopram (LEXAPRO) 10 MG tablet, TAKE ONE TABLET EVERY DAY, Disp: 30 tablet, Rfl: 0 .  Cholecalciferol (D3 DOTS) 2000 UNITS TBDP, Take by mouth., Disp: , Rfl:  .  Cobalamine Combinations (B-12) [phone] MCG SUBL, B12, Disp: , Rfl:  .  escitalopram (LEXAPRO) 10 MG tablet, escitalopram 10 mg tablet, Disp: , Rfl:  .  fluticasone (FLONASE) 50 MCG/ACT nasal spray, Place 2 sprays into both nostrils daily. (Patient not taking: Reported on 11/27/2016), Disp: 16 g, Rfl: 6 .  FLUTICASONE PROPIONATE, NASAL, NA, Fluticasone Propionate (Nasal), Disp: , Rfl:  .  ibuprofen (ADVIL,MOTRIN) 200 MG tablet, Take 400 mg by mouth every 6 (six) hours as needed., Disp: , Rfl:  .  pantoprazole (PROTONIX) 40 MG tablet, pantoprazole 40 mg tablet,delayed release, Disp: , Rfl:  .  terbinafine (LAMISIL) 250 MG tablet, Take 1 tablet (250 mg total) by mouth daily. (Patient not taking: Reported on 11/27/2016), Disp: 90 tablet, Rfl: 0 .  terbinafine (LAMISIL) 250 MG tablet, Take 1 tablet (250 mg total) by mouth daily. (Patient not  taking: Reported on 11/27/2016), Disp: 30 tablet, Rfl: 0 Review of Systems  Constitutional: Negative for activity change, appetite change, chills, diaphoresis, fatigue, fever and unexpected weight change.  HENT: Positive for congestion, ear pain, postnasal drip, rhinorrhea, sneezing, sore throat and tinnitus. Negative for dental problem, drooling, ear discharge, facial swelling, hearing loss, mouth sores, nosebleeds, sinus pain, sinus pressure, trouble swallowing and voice change.   Eyes: Positive for discharge (watery left eye ). Negative for photophobia, pain, redness, itching and visual disturbance.  Respiratory: Negative for apnea, cough, choking, chest tightness, shortness of breath, wheezing and stridor.   Cardiovascular: Negative for chest pain, palpitations and leg swelling.  Gastrointestinal: Negative.   Genitourinary: Negative.   Musculoskeletal: Negative.   Neurological: Negative.   Psychiatric/Behavioral: Negative.     Physical Exam  Constitutional: She is oriented to person, place, and time. Vital signs are normal. She appears well-developed and well-nourished. She is active. No distress.  HENT:  Head: Normocephalic and atraumatic.  Right Ear: Hearing, tympanic membrane, external ear and ear canal normal.  Left Ear: Hearing, tympanic membrane, external ear and ear canal normal.  Nose: Mucosal edema and rhinorrhea present. Right sinus exhibits maxillary sinus tenderness. Right sinus exhibits no frontal sinus tenderness. Left sinus exhibits maxillary sinus tenderness. Left sinus exhibits no frontal sinus tenderness.  Mouth/Throat: Uvula is midline and mucous membranes are normal. Mucous membranes are not pale, not dry and not cyanotic. No uvula  swelling. Posterior oropharyngeal erythema (cobblestoning also present ) present. No oropharyngeal exudate or posterior oropharyngeal edema.  Eyes: Pupils are equal, round, and reactive to light. Conjunctivae and EOM are normal. Right eye  exhibits no discharge. Left eye exhibits no discharge. No scleral icterus.  Neck: Trachea normal, normal range of motion, full passive range of motion without pain and phonation normal. Neck supple. Normal carotid pulses, no hepatojugular reflux and no JVD present. No tracheal tenderness present. Carotid bruit is not present. No tracheal deviation present. No Brudzinski's sign and no Kernig's sign noted. No thyroid mass and no thyromegaly present.  Cardiovascular: Normal rate, regular rhythm, normal heart sounds and intact distal pulses.  Exam reveals no gallop and no friction rub.   No murmur heard. Pulmonary/Chest: Effort normal and breath sounds normal. No stridor. No respiratory distress. She has no wheezes. She has no rales. She exhibits no tenderness.  Abdominal: Soft. Bowel sounds are normal. She exhibits no distension and no mass. There is no tenderness. There is no rebound and no guarding.  Musculoskeletal: Normal range of motion. She exhibits no edema, tenderness or deformity.  Lymphadenopathy:       Head (right side): Tonsillar adenopathy present.       Head (left side): Tonsillar adenopathy present.    She has no cervical adenopathy.       Right cervical: No superficial cervical adenopathy present.      Left cervical: No superficial cervical adenopathy present.  Neurological: She is alert and oriented to person, place, and time.  Skin: Skin is warm and dry. No rash noted. She is not diaphoretic. No cyanosis or erythema. No pallor. Nails show no clubbing.  Psychiatric: She has a normal mood and affect. Her speech is normal and behavior is normal. Judgment and thought content normal. Cognition and memory are normal.  Vitals reviewed.      Assessment:      Sinusitis, unspecified chronicity, unspecified location  Allergic rhinitis, unspecified seasonality, unspecified trigger  Plan:   Meds ordered this encounter  Medications  . escitalopram (LEXAPRO) 10 MG tablet    Sig:  escitalopram 10 mg tablet  . amoxicillin-clavulanate (AUGMENTIN) 875-125 MG tablet    Sig: Take 1 tablet by mouth 2 (two) times daily.    Dispense:  20 tablet    Refill:  0     ONLY  ANTIBIOTIC PRESCRIBED AS ABOVE( not Lexapro)   Restart Claritin as per package instructions.   Return to clinic at any time  if any new symptoms change, worsen or do not improve. Symptoms should improve  within 72 hours and if not improving you should call for an appointment at the clinic or bee seen in urgent care/ED if clinic is closed.  Patient verbalized above understanding of all instructions. Patient verbalized understanding of instructions and denies any further questions at this time.

## 2016-11-27 NOTE — Patient Instructions (Signed)
Allergic Rhinitis Allergic rhinitis is when the mucous membranes in the nose respond to allergens. Allergens are particles in the air that cause your body to have an allergic reaction. This causes you to release allergic antibodies. Through a chain of events, these eventually cause you to release histamine into the blood stream. Although meant to protect the body, it is this release of histamine that causes your discomfort, such as frequent sneezing, congestion, and an itchy, runny nose. What are the causes? Seasonal allergic rhinitis (hay fever) is caused by pollen allergens that may come from grasses, trees, and weeds. Year-round allergic rhinitis (perennial allergic rhinitis) is caused by allergens such as house dust mites, pet dander, and mold spores. What are the signs or symptoms?  Nasal stuffiness (congestion).  Itchy, runny nose with sneezing and tearing of the eyes. How is this diagnosed? Your health care provider can help you determine the allergen or allergens that trigger your symptoms. If you and your health care provider are unable to determine the allergen, skin or blood testing may be used. Your health care provider will diagnose your condition after taking your health history and performing a physical exam. Your health care provider may assess you for other related conditions, such as asthma, pink eye, or an ear infection. How is this treated? Allergic rhinitis does not have a cure, but it can be controlled by:  Medicines that block allergy symptoms. These may include allergy shots, nasal sprays, and oral antihistamines.  Avoiding the allergen.  Hay fever may often be treated with antihistamines in pill or nasal spray forms. Antihistamines block the effects of histamine. There are over-the-counter medicines that may help with nasal congestion and swelling around the eyes. Check with your health care provider before taking or giving this medicine. If avoiding the allergen or the  medicine prescribed do not work, there are many new medicines your health care provider can prescribe. Stronger medicine may be used if initial measures are ineffective. Desensitizing injections can be used if medicine and avoidance does not work. Desensitization is when a patient is given ongoing shots until the body becomes less sensitive to the allergen. Make sure you follow up with your health care provider if problems continue. Follow these instructions at home: It is not possible to completely avoid allergens, but you can reduce your symptoms by taking steps to limit your exposure to them. It helps to know exactly what you are allergic to so that you can avoid your specific triggers. Contact a health care provider if:  You have a fever.  You develop a cough that does not stop easily (persistent).  You have shortness of breath.  You start wheezing.  Symptoms interfere with normal daily activities. This information is not intended to replace advice given to you by your health care provider. Make sure you discuss any questions you have with your health care provider. Document Released: 12/11/2000 Document Revised: 11/17/2015 Document Reviewed: 11/23/2012 Elsevier Interactive Patient Education  2017 [employer] Sinusitis, Adult Sinusitis is soreness and inflammation of your sinuses. Sinuses are hollow spaces in the bones around your face. Your sinuses are located:  Around your eyes.  In the middle of your forehead.  Behind your nose.  In your cheekbones.  Your sinuses and nasal passages are lined with a stringy fluid (mucus). Mucus normally drains out of your sinuses. When your nasal tissues become inflamed or swollen, the mucus can become trapped or blocked so air cannot flow through your sinuses. This allows bacteria,  viruses, and funguses to grow, which leads to infection. Sinusitis can develop quickly and last for 7?10 days (acute) or for more than 12 weeks (chronic). Sinusitis  often develops after a cold. What are the causes? This condition is caused by anything that creates swelling in the sinuses or stops mucus from draining, including:  Allergies.  Asthma.  Bacterial or viral infection.  Abnormally shaped bones between the nasal passages.  Nasal growths that contain mucus (nasal polyps).  Narrow sinus openings.  Pollutants, such as chemicals or irritants in the air.  A foreign object stuck in the nose.  A fungal infection. This is rare.  What increases the risk? The following factors may make you more likely to develop this condition:  Having allergies or asthma.  Having had a recent cold or respiratory tract infection.  Having structural deformities or blockages in your nose or sinuses.  Having a weak immune system.  Doing a lot of swimming or diving.  Overusing nasal sprays.  Smoking.  What are the signs or symptoms? The main symptoms of this condition are pain and a feeling of pressure around the affected sinuses. Other symptoms include:  Upper toothache.  Earache.  Headache.  Bad breath.  Decreased sense of smell and taste.  A cough that may get worse at night.  Fatigue.  Fever.  Thick drainage from your nose. The drainage is often green and it may contain pus (purulent).  Stuffy nose or congestion.  Postnasal drip. This is when extra mucus collects in the throat or back of the nose.  Swelling and warmth over the affected sinuses.  Sore throat.  Sensitivity to light.  How is this diagnosed? This condition is diagnosed based on symptoms, a medical history, and a physical exam. To find out if your condition is acute or chronic, your health care provider may:  Look in your nose for signs of nasal polyps.  Tap over the affected sinus to check for signs of infection.  View the inside of your sinuses using an imaging device that has a light attached (endoscope).  If your health care provider suspects that you  have chronic sinusitis, you may also:  Be tested for allergies.  Have a sample of mucus taken from your nose (nasal culture) and checked for bacteria.  Have a mucus sample examined to see if your sinusitis is related to an allergy.  If your sinusitis does not respond to treatment and it lasts longer than 8 weeks, you may have an MRI or CT scan to check your sinuses. These scans also help to determine how severe your infection is. In rare cases, a bone biopsy may be done to rule out more serious types of fungal sinus disease. How is this treated? Treatment for sinusitis depends on the cause and whether your condition is chronic or acute. If a virus is causing your sinusitis, your symptoms will go away on their own within 10 days. You may be given medicines to relieve your symptoms, including:  Topical nasal decongestants. They shrink swollen nasal passages and let mucus drain from your sinuses.  Antihistamines. These drugs block inflammation that is triggered by allergies. This can help to ease swelling in your nose and sinuses.  Topical nasal corticosteroids. These are nasal sprays that ease inflammation and swelling in your nose and sinuses.  Nasal saline washes. These rinses can help to get rid of thick mucus in your nose.  If your condition is caused by bacteria, you will be given an  antibiotic medicine. If your condition is caused by a fungus, you will be given an antifungal medicine. Surgery may be needed to correct underlying conditions, such as narrow nasal passages. Surgery may also be needed to remove polyps. Follow these instructions at home: Medicines  Take, use, or apply over-the-counter and prescription medicines only as told by your health care provider. These may include nasal sprays.  If you were prescribed an antibiotic medicine, take it as told by your health care provider. Do not stop taking the antibiotic even if you start to feel better. Hydrate and Humidify  Drink  enough water to keep your urine clear or pale yellow. Staying hydrated will help to thin your mucus.  Use a cool mist humidifier to keep the humidity level in your home above 50%.  Inhale steam for 10-15 minutes, 3-4 times a day or as told by your health care provider. You can do this in the bathroom while a hot shower is running.  Limit your exposure to cool or dry air. Rest  Rest as much as possible.  Sleep with your head raised (elevated).  Make sure to get enough sleep each night. General instructions  Apply a warm, moist washcloth to your face 3-4 times a day or as told by your health care provider. This will help with discomfort.  Wash your hands often with soap and water to reduce your exposure to viruses and other germs. If soap and water are not available, use hand sanitizer.  Do not smoke. Avoid being around people who are smoking (secondhand smoke).  Keep all follow-up visits as told by your health care provider. This is important. Contact a health care provider if:  You have a fever.  Your symptoms get worse.  Your symptoms do not improve within 10 days. Get help right away if:  You have a severe headache.  You have persistent vomiting.  You have pain or swelling around your face or eyes.  You have vision problems.  You develop confusion.  Your neck is stiff.  You have trouble breathing. This information is not intended to replace advice given to you by your health care provider. Make sure you discuss any questions you have with your health care provider. Document Released: 03/18/2005 Document Revised: 11/12/2015 Document Reviewed: 01/11/2015 Elsevier Interactive Patient Education  2017 [employer].

## 2016-11-29 ENCOUNTER — Ambulatory Visit (INDEPENDENT_AMBULATORY_CARE_PROVIDER_SITE_OTHER): Payer: BLUE CROSS/BLUE SHIELD | Admitting: Physician Assistant

## 2016-11-29 ENCOUNTER — Encounter: Payer: Self-pay | Admitting: Physician Assistant

## 2016-11-29 VITALS — BP 140/88 | HR 72 | Temp 98.4°F | Ht 62.0 in | Wt 168.2 lb

## 2016-11-29 DIAGNOSIS — Z136 Encounter for screening for cardiovascular disorders: Secondary | ICD-10-CM | POA: Diagnosis not present

## 2016-11-29 DIAGNOSIS — Z1231 Encounter for screening mammogram for malignant neoplasm of breast: Secondary | ICD-10-CM | POA: Diagnosis not present

## 2016-11-29 DIAGNOSIS — F4329 Adjustment disorder with other symptoms: Secondary | ICD-10-CM

## 2016-11-29 DIAGNOSIS — E0590 Thyrotoxicosis, unspecified without thyrotoxic crisis or storm: Secondary | ICD-10-CM

## 2016-11-29 DIAGNOSIS — Z13220 Encounter for screening for lipoid disorders: Secondary | ICD-10-CM | POA: Diagnosis not present

## 2016-11-29 DIAGNOSIS — Z131 Encounter for screening for diabetes mellitus: Secondary | ICD-10-CM | POA: Diagnosis not present

## 2016-11-29 DIAGNOSIS — Z0000 Encounter for general adult medical examination without abnormal findings: Secondary | ICD-10-CM

## 2016-11-29 DIAGNOSIS — Z789 Other specified health status: Secondary | ICD-10-CM | POA: Diagnosis not present

## 2016-11-29 DIAGNOSIS — Z1239 Encounter for other screening for malignant neoplasm of breast: Secondary | ICD-10-CM

## 2016-11-29 DIAGNOSIS — Z1211 Encounter for screening for malignant neoplasm of colon: Secondary | ICD-10-CM | POA: Diagnosis not present

## 2016-11-29 DIAGNOSIS — Z7289 Other problems related to lifestyle: Secondary | ICD-10-CM

## 2016-11-29 MED ORDER — ESCITALOPRAM OXALATE 10 MG PO TABS: 10.0000 mg | ORAL_TABLET | Freq: Every day | ORAL | 1 refills | Status: DC

## 2016-11-29 NOTE — Progress Notes (Signed)
Patient: Dana Jarvis, Female    DOB: [birth year], 51 y.o.   MRN: [id number] Visit Date: 11/29/2016  Today's Provider: Mar Daring, PA-C   Chief Complaint  Patient presents with  . Annual Exam   Subjective:    Annual physical exam Dana Jarvis is a 51 y.o. female who presents today for health maintenance and complete physical. She feels well. She reports exercising. She reports she is sleeping well.  Colonoscopy:Patient was scheduled for one and got canceled. She wants another referral to GI. Dr.Wohl. Mammogram scheduled for 12/06/16 Pap:01/26/14 Negative, HPV-Negative, Hysterectomy done 12 years ago per patient. Per patient she gets Flu vaccine at the school. (She works for [employer]) [place] -----------------------------------------------------------------   Review of Systems  Constitutional: Positive for activity change.  HENT: Negative.   Eyes: Negative.   Respiratory: Negative.   Cardiovascular: Negative.   Gastrointestinal: Negative.   Endocrine: Negative.   Genitourinary: Negative.   Musculoskeletal: Negative.   Skin: Negative.   Allergic/Immunologic: Positive for environmental allergies.  Neurological: Negative.   Hematological: Negative.   Psychiatric/Behavioral: Negative.     Social History      She  reports that she has never smoked. She has never used smokeless tobacco. She reports that she drinks about 2.4 oz of alcohol per week . She reports that she does not use drugs.       Social History   Social History  . Marital status: Widowed    Spouse name: N/A  . Number of children: N/A  . Years of education: N/A   Social History Main Topics  . Smoking status: Never Smoker  . Smokeless tobacco: Never Used  . Alcohol use 2.4 oz/week    4 Glasses of wine per week     Comment: occasional  . Drug use: No  . Sexual activity: Not Asked   Other Topics Concern  . None   Social History Narrative  . None    Past Medical  History:  Diagnosis Date  . Arthritis    hands, feet  . Complication of anesthesia    has awakened during procedures  . GERD (gastroesophageal reflux disease)   . Headache    sinus  . Motion sickness    ocean ships  . Seasonal allergies   . Wears contact lenses      Patient Active Problem List   Diagnosis Date Noted  . Arthritis 04/05/2015  . Acid reflux 04/05/2015  . Blood in the urine 04/05/2015  . Cannot sleep 04/05/2015  . Adiposity 04/05/2015  . Shortened PR interval 04/05/2015  . Hyperthyroidism, subclinical 01/17/2015  . Snores 01/17/2015  . Blood pressure elevated without history of HTN 09/26/2009  . Adaptation reaction 10/28/2007  . Alcohol drinker 10/28/2007  . Allergic rhinitis 10/28/2007    Past Surgical History:  Procedure Laterality Date  . ABDOMINAL HYSTERECTOMY  2005   with bilateral salpingo-oophorectomy  . CESAREAN SECTION  1988  . FRACTURE SURGERY Right 2007   ankle  . TONSILLECTOMY    . WRIST SURGERY Left    ganglion    Family History        Family Status  Relation Status  . Mother Deceased at age 64  . Father Deceased at age 30  . Brother Alive  . Sister Alive        Her family history includes Arthritis in her mother; Cancer in her father and mother; Glaucoma in her mother; Healthy in her sister; Hypertension in  her brother.     Allergies  Allergen Reactions  . Latex Rash  . Sulfa Antibiotics Rash     Current Outpatient Prescriptions:  .  Cetirizine HCl (ZYRTEC ALLERGY) 10 MG CAPS, Take by mouth., Disp: , Rfl:  .  Cholecalciferol (D3 DOTS) 2000 UNITS TBDP, Take by mouth., Disp: , Rfl:  .  Cobalamine Combinations (B-12) [phone] MCG SUBL, B12, Disp: , Rfl:  .  escitalopram (LEXAPRO) 10 MG tablet, TAKE ONE TABLET EVERY DAY, Disp: 30 tablet, Rfl: 0 .  fluticasone (FLONASE) 50 MCG/ACT nasal spray, Place 2 sprays into both nostrils daily., Disp: 16 g, Rfl: 6 .  terbinafine (LAMISIL) 250 MG tablet, Take 1 tablet (250 mg total) by  mouth daily., Disp: 90 tablet, Rfl: 0 .  amoxicillin-clavulanate (AUGMENTIN) 875-125 MG tablet, Take 1 tablet by mouth 2 (two) times daily. (Patient not taking: Reported on 11/29/2016), Disp: 20 tablet, Rfl: 0 .  ibuprofen (ADVIL,MOTRIN) 200 MG tablet, Take 400 mg by mouth every 6 (six) hours as needed., Disp: , Rfl:    Patient Care Team: Mar Daring, PA-C as PCP - General (Family Medicine)      Objective:   Vitals: BP 140/88 (BP Location: Left Arm, Patient Position: Sitting, Cuff Size: Normal) Comment: Taking Sudafed  Pulse 72   Temp 98.4 F (36.9 C) (Oral)   Ht 5\' 2"  (1.575 m)   Wt 168 lb 3.2 oz (76.3 kg)   LMP  (LMP Unknown)   BMI 30.76 kg/m    Vitals:   11/29/16 1022  BP: 140/88  Pulse: 72  Temp: 98.4 F (36.9 C)  TempSrc: Oral  Weight: 168 lb 3.2 oz (76.3 kg)  Height: 5\' 2"  (1.575 m)     Physical Exam  Constitutional: She is oriented to person, place, and time. She appears well-developed and well-nourished. No distress.  HENT:  Head: Normocephalic and atraumatic.  Right Ear: Hearing, tympanic membrane, external ear and ear canal normal.  Left Ear: Hearing, tympanic membrane, external ear and ear canal normal.  Nose: Nose normal.  Mouth/Throat: Uvula is midline, oropharynx is clear and moist and mucous membranes are normal. No oropharyngeal exudate.  Eyes: Pupils are equal, round, and reactive to light. Conjunctivae and EOM are normal. Right eye exhibits no discharge. Left eye exhibits no discharge. No scleral icterus.  Neck: Normal range of motion. Neck supple. No JVD present. Carotid bruit is not present. No tracheal deviation present. No thyromegaly present.  Cardiovascular: Normal rate, regular rhythm, normal heart sounds and intact distal pulses.  Exam reveals no gallop and no friction rub.   No murmur heard. Pulmonary/Chest: Effort normal and breath sounds normal. No respiratory distress. She has no wheezes. She has no rales. She exhibits no tenderness.  Right breast exhibits no inverted nipple, no mass, no nipple discharge, no skin change and no tenderness. Left breast exhibits no inverted nipple, no mass, no nipple discharge, no skin change and no tenderness. Breasts are symmetrical.  Abdominal: Soft. Bowel sounds are normal. She exhibits no distension and no mass. There is no tenderness. There is no rebound and no guarding.  Musculoskeletal: Normal range of motion. She exhibits no edema or tenderness.  Lymphadenopathy:    She has no cervical adenopathy.  Neurological: She is alert and oriented to person, place, and time. She has normal reflexes.  Skin: Skin is warm and dry. No rash noted. She is not diaphoretic.  Psychiatric: She has a normal mood and affect. Her behavior is normal. Judgment and thought  content normal.  Vitals reviewed.    Depression Screen PHQ 2/9 Scores 11/29/2016 04/06/2015  PHQ - 2 Score 0 0      Assessment & Plan:     Routine Health Maintenance and Physical Exam  Exercise Activities and Dietary recommendations Goals    . Exercise 150 minutes per week (moderate activity)        There is no immunization history on file for this patient.  Health Maintenance  Topic Date Due  . HIV Screening  03/15/1979  . TETANUS/TDAP  03/15/1983  . COLONOSCOPY  03/14/2014  . MAMMOGRAM  01/27/2016  . INFLUENZA VACCINE  10/30/2016  . PAP SMEAR  01/26/2017  . Hepatitis C Screening  Completed     Discussed health benefits of physical activity, and encouraged her to engage in regular exercise appropriate for her age and condition.    1. Annual physical exam Normal physical exam today. Will check labs as below and f/u pending lab results. If labs are stable and WNL she will not need to have these rechecked for one year at her next annual physical exam. She is to call the office in the meantime if she has any acute issue, questions or concerns. - CBC with Differential/Platelet - Comprehensive metabolic panel  2. Breast  cancer screening Breast exam normal. Mammogram scheduled for 12/06/16.  3. Colon cancer screening Last colonoscopy canceled because patient's insurance did not cover the anesthesiologist from [facility]. She requires [facility]esthesiologist.  - Ambulatory referral to Gastroenterology  4. Hyperthyroidism, subclinical Will check labs as below and f/u pending results. - TSH  5. Alcohol drinker Will check labs as below and f/u pending results. - Comprehensive metabolic panel  6. Encounter for lipid screening for cardiovascular disease Will check labs as below and f/u pending results. - Lipid panel  7. Diabetes mellitus screening Will check labs as below and f/u pending results. - Hemoglobin A1c  8. Adjustment disorder with other symptom Stable. Diagnosis pulled for medication refill. Continue current medical treatment plan. - escitalopram (LEXAPRO) 10 MG tablet; Take 1 tablet (10 mg total) by mouth daily.  Dispense: 90 tablet; Refill: 1  --------------------------------------------------------------------    Mar Daring, PA-C  [facility]

## 2016-11-29 NOTE — Patient Instructions (Signed)
Health Maintenance for Postmenopausal Women Menopause is a normal process in which your reproductive ability comes to an end. This process happens gradually over a span of months to years, usually between the ages of 22 and 9. Menopause is complete when you have missed 12 consecutive menstrual periods. It is important to talk with your health care provider about some of the most common conditions that affect postmenopausal women, such as heart disease, cancer, and bone loss (osteoporosis). Adopting a healthy lifestyle and getting preventive care can help to promote your health and wellness. Those actions can also lower your chances of developing some of these common conditions. What should I know about menopause? During menopause, you may experience a number of symptoms, such as:  Moderate-to-severe hot flashes.  Night sweats.  Decrease in sex drive.  Mood swings.  Headaches.  Tiredness.  Irritability.  Memory problems.  Insomnia.  Choosing to treat or not to treat menopausal changes is an individual decision that you make with your health care provider. What should I know about hormone replacement therapy and supplements? Hormone therapy products are effective for treating symptoms that are associated with menopause, such as hot flashes and night sweats. Hormone replacement carries certain risks, especially as you become older. If you are thinking about using estrogen or estrogen with progestin treatments, discuss the benefits and risks with your health care provider. What should I know about heart disease and stroke? Heart disease, heart attack, and stroke become more likely as you age. This may be due, in part, to the hormonal changes that your body experiences during menopause. These can affect how your body processes dietary fats, triglycerides, and cholesterol. Heart attack and stroke are both medical emergencies. There are many things that you can do to help prevent heart disease  and stroke:  Have your blood pressure checked at least every 1-2 years. High blood pressure causes heart disease and increases the risk of stroke.  If you are 53-22 years old, ask your health care provider if you should take aspirin to prevent a heart attack or a stroke.  Do not use any tobacco products, including cigarettes, chewing tobacco, or electronic cigarettes. If you need help quitting, ask your health care provider.  It is important to eat a healthy diet and maintain a healthy weight. ? Be sure to include plenty of vegetables, fruits, low-fat dairy products, and lean protein. ? Avoid eating foods that are high in solid fats, added sugars, or salt (sodium).  Get regular exercise. This is one of the most important things that you can do for your health. ? Try to exercise for at least 150 minutes each week. The type of exercise that you do should increase your heart rate and make you sweat. This is known as moderate-intensity exercise. ? Try to do strengthening exercises at least twice each week. Do these in addition to the moderate-intensity exercise.  Know your numbers.Ask your health care provider to check your cholesterol and your blood glucose. Continue to have your blood tested as directed by your health care provider.  What should I know about cancer screening? There are several types of cancer. Take the following steps to reduce your risk and to catch any cancer development as early as possible. Breast Cancer  Practice breast self-awareness. ? This means understanding how your breasts normally appear and feel. ? It also means doing regular breast self-exams. Let your health care provider know about any changes, no matter how small.  If you are 40  or older, have a clinician do a breast exam (clinical breast exam or CBE) every year. Depending on your age, family history, and medical history, it may be recommended that you also have a yearly breast X-ray (mammogram).  If you  have a family history of breast cancer, talk with your health care provider about genetic screening.  If you are at high risk for breast cancer, talk with your health care provider about having an MRI and a mammogram every year.  Breast cancer (BRCA) gene test is recommended for women who have family members with BRCA-related cancers. Results of the assessment will determine the need for genetic counseling and BRCA1 and for BRCA2 testing. BRCA-related cancers include these types: ? Breast. This occurs in males or females. ? Ovarian. ? Tubal. This may also be called fallopian tube cancer. ? Cancer of the abdominal or pelvic lining (peritoneal cancer). ? Prostate. ? Pancreatic.  Cervical, Uterine, and Ovarian Cancer Your health care provider may recommend that you be screened regularly for cancer of the pelvic organs. These include your ovaries, uterus, and vagina. This screening involves a pelvic exam, which includes checking for microscopic changes to the surface of your cervix (Pap test).  For women ages 21-65, health care providers may recommend a pelvic exam and a Pap test every three years. For women ages 79-65, they may recommend the Pap test and pelvic exam, combined with testing for human papilloma virus (HPV), every five years. Some types of HPV increase your risk of cervical cancer. Testing for HPV may also be done on women of any age who have unclear Pap test results.  Other health care providers may not recommend any screening for nonpregnant women who are considered low risk for pelvic cancer and have no symptoms. Ask your health care provider if a screening pelvic exam is right for you.  If you have had past treatment for cervical cancer or a condition that could lead to cancer, you need Pap tests and screening for cancer for at least 20 years after your treatment. If Pap tests have been discontinued for you, your risk factors (such as having a new sexual partner) need to be  reassessed to determine if you should start having screenings again. Some women have medical problems that increase the chance of getting cervical cancer. In these cases, your health care provider may recommend that you have screening and Pap tests more often.  If you have a family history of uterine cancer or ovarian cancer, talk with your health care provider about genetic screening.  If you have vaginal bleeding after reaching menopause, tell your health care provider.  There are currently no reliable tests available to screen for ovarian cancer.  Lung Cancer Lung cancer screening is recommended for adults 69-62 years old who are at high risk for lung cancer because of a history of smoking. A yearly low-dose CT scan of the lungs is recommended if you:  Currently smoke.  Have a history of at least 30 pack-years of smoking and you currently smoke or have quit within the past 15 years. A pack-year is smoking an average of one pack of cigarettes per day for one year.  Yearly screening should:  Continue until it has been 15 years since you quit.  Stop if you develop a health problem that would prevent you from having lung cancer treatment.  Colorectal Cancer  This type of cancer can be detected and can often be prevented.  Routine colorectal cancer screening usually begins at  age 42 and continues through age 45.  If you have risk factors for colon cancer, your health care provider may recommend that you be screened at an earlier age.  If you have a family history of colorectal cancer, talk with your health care provider about genetic screening.  Your health care provider may also recommend using home test kits to check for hidden blood in your stool.  A small camera at the end of a tube can be used to examine your colon directly (sigmoidoscopy or colonoscopy). This is done to check for the earliest forms of colorectal cancer.  Direct examination of the colon should be repeated every  5-10 years until age 71. However, if early forms of precancerous polyps or small growths are found or if you have a family history or genetic risk for colorectal cancer, you may need to be screened more often.  Skin Cancer  Check your skin from head to toe regularly.  Monitor any moles. Be sure to tell your health care provider: ? About any new moles or changes in moles, especially if there is a change in a mole's shape or color. ? If you have a mole that is larger than the size of a pencil eraser.  If any of your family members has a history of skin cancer, especially at a young age, talk with your health care provider about genetic screening.  Always use sunscreen. Apply sunscreen liberally and repeatedly throughout the day.  Whenever you are outside, protect yourself by wearing long sleeves, pants, a wide-brimmed hat, and sunglasses.  What should I know about osteoporosis? Osteoporosis is a condition in which bone destruction happens more quickly than new bone creation. After menopause, you may be at an increased risk for osteoporosis. To help prevent osteoporosis or the bone fractures that can happen because of osteoporosis, the following is recommended:  If you are 46-71 years old, get at least 1,000 mg of calcium and at least 600 mg of vitamin D per day.  If you are older than age 55 but younger than age 65, get at least 1,200 mg of calcium and at least 600 mg of vitamin D per day.  If you are older than age 54, get at least 1,200 mg of calcium and at least 800 mg of vitamin D per day.  Smoking and excessive alcohol intake increase the risk of osteoporosis. Eat foods that are rich in calcium and vitamin D, and do weight-bearing exercises several times each week as directed by your health care provider. What should I know about how menopause affects my mental health? Depression may occur at any age, but it is more common as you become older. Common symptoms of depression  include:  Low or sad mood.  Changes in sleep patterns.  Changes in appetite or eating patterns.  Feeling an overall lack of motivation or enjoyment of activities that you previously enjoyed.  Frequent crying spells.  Talk with your health care provider if you think that you are experiencing depression. What should I know about immunizations? It is important that you get and maintain your immunizations. These include:  Tetanus, diphtheria, and pertussis (Tdap) booster vaccine.  Influenza every year before the flu season begins.  Pneumonia vaccine.  Shingles vaccine.  Your health care provider may also recommend other immunizations. This information is not intended to replace advice given to you by your health care provider. Make sure you discuss any questions you have with your health care provider. Document Released: 05/10/2005  Document Revised: 10/06/2015 Document Reviewed: 12/20/2014 Elsevier Interactive Patient Education  2018 Elsevier Inc.  

## 2016-12-06 ENCOUNTER — Ambulatory Visit
Admission: RE | Admit: 2016-12-06 | Discharge: 2016-12-06 | Disposition: A | Discharge status: Home / Self Care | Payer: BLUE CROSS/BLUE SHIELD | Source: Ambulatory Visit | Attending: Medical | Admitting: Medical

## 2016-12-06 DIAGNOSIS — Z1231 Encounter for screening mammogram for malignant neoplasm of breast: Secondary | ICD-10-CM

## 2016-12-18 ENCOUNTER — Other Ambulatory Visit: Payer: Self-pay

## 2016-12-18 DIAGNOSIS — Z1211 Encounter for screening for malignant neoplasm of colon: Secondary | ICD-10-CM

## 2016-12-31 ENCOUNTER — Ambulatory Visit
Admission: RE | Admit: 2016-12-31 | Discharge: 2016-12-31 | Disposition: A | Discharge status: Home / Self Care | Payer: BLUE CROSS/BLUE SHIELD | Source: Ambulatory Visit | Attending: Gastroenterology | Admitting: Gastroenterology

## 2016-12-31 ENCOUNTER — Encounter
Admission: RE | Disposition: A | Discharge status: Home / Self Care | Payer: Self-pay | Source: Ambulatory Visit | Attending: Gastroenterology

## 2016-12-31 ENCOUNTER — Ambulatory Visit: Payer: BLUE CROSS/BLUE SHIELD | Admitting: Anesthesiology

## 2016-12-31 DIAGNOSIS — Z91040 Latex allergy status: Secondary | ICD-10-CM | POA: Diagnosis not present

## 2016-12-31 DIAGNOSIS — Z801 Family history of malignant neoplasm of trachea, bronchus and lung: Secondary | ICD-10-CM | POA: Insufficient documentation

## 2016-12-31 DIAGNOSIS — K640 First degree hemorrhoids: Secondary | ICD-10-CM | POA: Diagnosis not present

## 2016-12-31 DIAGNOSIS — Z820 Family history of epilepsy and other diseases of the nervous system: Secondary | ICD-10-CM | POA: Diagnosis not present

## 2016-12-31 DIAGNOSIS — Z8489 Family history of other specified conditions: Secondary | ICD-10-CM | POA: Insufficient documentation

## 2016-12-31 DIAGNOSIS — K219 Gastro-esophageal reflux disease without esophagitis: Secondary | ICD-10-CM | POA: Diagnosis not present

## 2016-12-31 DIAGNOSIS — E0590 Thyrotoxicosis, unspecified without thyrotoxic crisis or storm: Secondary | ICD-10-CM | POA: Diagnosis not present

## 2016-12-31 DIAGNOSIS — Z803 Family history of malignant neoplasm of breast: Secondary | ICD-10-CM | POA: Diagnosis not present

## 2016-12-31 DIAGNOSIS — M19042 Primary osteoarthritis, left hand: Secondary | ICD-10-CM | POA: Insufficient documentation

## 2016-12-31 DIAGNOSIS — Z8261 Family history of arthritis: Secondary | ICD-10-CM | POA: Diagnosis not present

## 2016-12-31 DIAGNOSIS — Z90710 Acquired absence of both cervix and uterus: Secondary | ICD-10-CM | POA: Diagnosis not present

## 2016-12-31 DIAGNOSIS — M19072 Primary osteoarthritis, left ankle and foot: Secondary | ICD-10-CM | POA: Insufficient documentation

## 2016-12-31 DIAGNOSIS — Z882 Allergy status to sulfonamides status: Secondary | ICD-10-CM | POA: Diagnosis not present

## 2016-12-31 DIAGNOSIS — M19071 Primary osteoarthritis, right ankle and foot: Secondary | ICD-10-CM | POA: Insufficient documentation

## 2016-12-31 DIAGNOSIS — R51 Headache: Secondary | ICD-10-CM | POA: Insufficient documentation

## 2016-12-31 DIAGNOSIS — Z8249 Family history of ischemic heart disease and other diseases of the circulatory system: Secondary | ICD-10-CM | POA: Diagnosis not present

## 2016-12-31 DIAGNOSIS — Z79899 Other long term (current) drug therapy: Secondary | ICD-10-CM | POA: Diagnosis not present

## 2016-12-31 DIAGNOSIS — K648 Other hemorrhoids: Secondary | ICD-10-CM | POA: Diagnosis not present

## 2016-12-31 DIAGNOSIS — Z1211 Encounter for screening for malignant neoplasm of colon: Secondary | ICD-10-CM | POA: Diagnosis not present

## 2016-12-31 DIAGNOSIS — M19041 Primary osteoarthritis, right hand: Secondary | ICD-10-CM | POA: Diagnosis not present

## 2016-12-31 HISTORY — PX: COLONOSCOPY WITH PROPOFOL: SHX5780

## 2016-12-31 SURGERY — COLONOSCOPY WITH PROPOFOL
Anesthesia: General

## 2016-12-31 MED ORDER — PROPOFOL 10 MG/ML IV BOLUS
INTRAVENOUS | Status: DC | PRN
  Administered 2016-12-31: 50 mg via INTRAVENOUS
  Administered 2016-12-31: 20 mg via INTRAVENOUS
  Administered 2016-12-31: 50 mg via INTRAVENOUS
  Administered 2016-12-31 (×2): 20 mg via INTRAVENOUS

## 2016-12-31 MED ORDER — PROPOFOL 500 MG/50ML IV EMUL
INTRAVENOUS | Status: AC
  Filled 2016-12-31: qty 50

## 2016-12-31 MED ORDER — SODIUM CHLORIDE 0.9 % IV SOLN
INTRAVENOUS | Status: DC
  Administered 2016-12-31: 10:00:00 via INTRAVENOUS
  Administered 2016-12-31: 1000 mL via INTRAVENOUS

## 2016-12-31 NOTE — Op Note (Signed)
[facility] Gastroenterology Patient Name: Dana Jarvis Procedure Date: 12/31/2016 10:28 AM MRN: [id number] Account #: [id number] Date of Birth: [birth year] Admit Type: Outpatient Age: 51 Room: [facility] ENDO ROOM 4 Gender: Female Note Status: Finalized Procedure:            Colonoscopy Indications:          Screening for colorectal malignant neoplasm Providers:            [name] MD, MD Referring MD:         Mar Daring (Referring MD) Medicines:            Propofol per Anesthesia Complications:        No immediate complications. Procedure:            Pre-Anesthesia Assessment:                       - Prior to the procedure, a History and Physical was                        performed, and patient medications and allergies were                        reviewed. The patient's tolerance of previous                        anesthesia was also reviewed. The risks and benefits of                        the procedure and the sedation options and risks were                        discussed with the patient. All questions were                        answered, and informed consent was obtained. Prior                        Anticoagulants: The patient has taken no previous                        anticoagulant or antiplatelet agents. ASA Grade                        Assessment: II - A patient with mild systemic disease.                        After reviewing the risks and benefits, the patient was                        deemed in satisfactory condition to undergo the                        procedure.                       After obtaining informed consent, the colonoscope was                        passed under direct vision. Throughout the procedure,  the patient's blood pressure, pulse, and oxygen                        saturations were monitored continuously. The                        Colonoscope was introduced through the anus and            advanced to the the cecum, identified by appendiceal                        orifice and ileocecal valve. The colonoscopy was                        performed without difficulty. The patient tolerated the                        procedure well. The quality of the bowel preparation                        was excellent. Findings:      The perianal and digital rectal examinations were normal.      Non-bleeding internal hemorrhoids were found during retroflexion. The       hemorrhoids were Grade I (internal hemorrhoids that do not prolapse).      The exam was otherwise without abnormality. Impression:           - Non-bleeding internal hemorrhoids.                       - The examination was otherwise normal.                       - No specimens collected. Recommendation:       - Discharge patient to home.                       - Resume previous diet.                       - Continue present medications.                       - Repeat colonoscopy in 10 years for screening unless                        any change in family history or lower GI problems. Procedure Code(s):    --- Professional ---                       [phone], Colonoscopy, flexible; diagnostic, including                        collection of specimen(s) by brushing or washing, when                        performed (separate procedure) Diagnosis Code(s):    --- Professional ---                       Z12.11, Encounter for screening for malignant neoplasm  of colon CPT copyright 2016 American Medical Association. All rights reserved. The codes documented in this report are preliminary and upon coder review may  be revised to meet current compliance requirements. [name] MD, MD 12/31/2016 10:48:40 AM This report has been signed electronically. Number of Addenda: 0 Note Initiated On: 12/31/2016 10:28 AM Scope Withdrawal Time: 0 hours 4 minutes 26 seconds  Total Procedure Duration: 0 hours 10 minutes 2  seconds       [facility]

## 2016-12-31 NOTE — Anesthesia Preprocedure Evaluation (Signed)
Anesthesia Evaluation  Patient identified by MRN, date of birth, ID band Patient awake    Reviewed: Allergy & Precautions, H&P , NPO status , Patient's Chart, lab work & pertinent test results, reviewed documented beta blocker date and time   History of Anesthesia Complications (+) history of anesthetic complications  Airway Mallampati: II   Neck ROM: full    Dental  (+) Poor Dentition, Teeth Intact   Pulmonary neg pulmonary ROS,    Pulmonary exam normal        Cardiovascular negative cardio ROS Normal cardiovascular exam Rhythm:regular Rate:Normal     Neuro/Psych  Headaches, PSYCHIATRIC DISORDERS negative neurological ROS  negative psych ROS   GI/Hepatic negative GI ROS, Neg liver ROS, GERD  Medicated,  Endo/Other  negative endocrine ROSHyperthyroidism   Renal/GU negative Renal ROS  negative genitourinary   Musculoskeletal   Abdominal   Peds  Hematology negative hematology ROS (+)   Anesthesia Other Findings Past Medical History: No date: Arthritis     Comment:  hands, feet No date: Complication of anesthesia     Comment:  has awakened during procedures No date: GERD (gastroesophageal reflux disease) No date: Headache     Comment:  sinus No date: Motion sickness     Comment:  ocean ships No date: Seasonal allergies No date: Wears contact lenses Past Surgical History: 2005: ABDOMINAL HYSTERECTOMY     Comment:  with bilateral salpingo-oophorectomy 1988: CESAREAN SECTION 2007: FRACTURE SURGERY; Right     Comment:  ankle No date: TONSILLECTOMY No date: WRIST SURGERY; Left     Comment:  ganglion BMI    Body Mass Index:  29.81 kg/m     Reproductive/Obstetrics negative OB ROS                             Anesthesia Physical Anesthesia Plan  ASA: III  Anesthesia Plan: General   Post-op Pain Management:    Induction:   PONV Risk Score and Plan:   Airway Management  Planned:   Additional Equipment:   Intra-op Plan:   Post-operative Plan:   Informed Consent: I have reviewed the patients History and Physical, chart, labs and discussed the procedure including the risks, benefits and alternatives for the proposed anesthesia with the patient or authorized representative who has indicated his/her understanding and acceptance.   Dental Advisory Given  Plan Discussed with: CRNA  Anesthesia Plan Comments:         Anesthesia Quick Evaluation

## 2016-12-31 NOTE — H&P (Signed)
Dana Lame, MD [facility] [address], [place], [place] [address] Phone: [phone] Fax : [phone]  Primary Care Physician:  Mar Daring, PA-C Primary Gastroenterologist:  Dr. Allen Norris  Pre-Procedure History & Physical: HPI:  Dana Jarvis is a 51 y.o. female is here for a screening colonoscopy.   Past Medical History:  Diagnosis Date  . Arthritis    hands, feet  . Complication of anesthesia    has awakened during procedures  . GERD (gastroesophageal reflux disease)   . Headache    sinus  . Motion sickness    ocean ships  . Seasonal allergies   . Wears contact lenses     Past Surgical History:  Procedure Laterality Date  . ABDOMINAL HYSTERECTOMY  2005   with bilateral salpingo-oophorectomy  . CESAREAN SECTION  1988  . FRACTURE SURGERY Right 2007   ankle  . TONSILLECTOMY    . WRIST SURGERY Left    ganglion    Prior to Admission medications   Medication Sig Start Date End Date Taking? Authorizing Provider  Cetirizine HCl (ZYRTEC ALLERGY) 10 MG CAPS Take by mouth.   Yes [provider]  Cholecalciferol (D3 DOTS) 2000 UNITS TBDP Take by mouth.   Yes [provider]  Cobalamine Combinations (B-12) [phone] MCG SUBL B12   Yes [provider]  escitalopram (LEXAPRO) 10 MG tablet Take 1 tablet (10 mg total) by mouth daily. 11/29/16  Yes Fenton Malling M, PA-C  fluticasone (FLONASE) 50 MCG/ACT nasal spray Place 2 sprays into both nostrils daily. 07/10/16  Yes Ratcliffe, Heather R, PA-C  ibuprofen (ADVIL,MOTRIN) 200 MG tablet Take 400 mg by mouth every 6 (six) hours as needed.   Yes [provider]  terbinafine (LAMISIL) 250 MG tablet Take 1 tablet (250 mg total) by mouth daily. 05/27/16  Yes Hyatt, Max T, DPM    Allergies as of 12/18/2016 - Review Complete 11/29/2016  Allergen Reaction Noted  . Latex Rash 11/15/2014  . Sulfa antibiotics Rash 11/15/2014    Family History  Problem Relation Age of Onset  . Arthritis  Mother   . Cancer Mother        breast  . Glaucoma Mother   . Cancer Father        lung  . Hypertension Brother   . Healthy Sister     Social History   Social History  . Marital status: Widowed    Spouse name: N/A  . Number of children: N/A  . Years of education: N/A   Occupational History  . Not on file.   Social History Main Topics  . Smoking status: Never Smoker  . Smokeless tobacco: Never Used  . Alcohol use 2.4 oz/week    4 Glasses of wine per week     Comment: occasional  . Drug use: No  . Sexual activity: Not on file   Other Topics Concern  . Not on file   Social History Narrative  . No narrative on file    Review of Systems: See HPI, otherwise negative ROS  Physical Exam: BP 136/73   Pulse 66   Temp (!) 97.4 F (36.3 C) (Tympanic)   Resp 16   Ht 5\' 2"  (1.575 m)   Wt 163 lb (73.9 kg)   LMP  (LMP Unknown)   SpO2 99%   BMI 29.81 kg/m  General:   Alert,  pleasant and cooperative in NAD Head:  Normocephalic and atraumatic. Neck:  Supple; no masses or thyromegaly. Lungs:  Clear throughout to auscultation.    Heart:  Regular rate and rhythm. Abdomen:  Soft, nontender and nondistended. Normal bowel sounds, without guarding, and without rebound.   Neurologic:  Alert and  oriented x4;  grossly normal neurologically.  Impression/Plan: Dana Jarvis is now here to undergo a screening colonoscopy.  Risks, benefits, and alternatives regarding colonoscopy have been reviewed with the patient.  Questions have been answered.  All parties agreeable.

## 2016-12-31 NOTE — Transfer of Care (Signed)
Immediate Anesthesia Transfer of Care Note  Patient: Dana Jarvis  Procedure(s) Performed: COLONOSCOPY WITH PROPOFOL (N/A )  Patient Location: PACU and Endoscopy Unit  Anesthesia Type:General  Level of Consciousness: awake, alert  and oriented  Airway & Oxygen Therapy: Patient Spontanous Breathing and Patient connected to nasal cannula oxygen  Post-op Assessment: Report given to RN and Post -op Vital signs reviewed and stable  Post vital signs: Reviewed and stable  Last Vitals:  Vitals:   12/31/16 0906  BP: 136/73  Pulse: 66  Resp: 16  Temp: (!) 36.3 C  SpO2: 99%    Last Pain:  Vitals:   12/31/16 0906  TempSrc: Tympanic         Complications: No apparent anesthesia complications

## 2016-12-31 NOTE — Anesthesia Post-op Follow-up Note (Signed)
Anesthesia QCDR form completed.        

## 2017-01-01 ENCOUNTER — Encounter: Payer: Self-pay | Admitting: Gastroenterology

## 2017-01-01 NOTE — Anesthesia Postprocedure Evaluation (Signed)
Anesthesia Post Note  Patient: Dana Jarvis  Procedure(s) Performed: COLONOSCOPY WITH PROPOFOL (N/A )  Patient location during evaluation: PACU Anesthesia Type: General Level of consciousness: awake and alert Pain management: pain level controlled Vital Signs Assessment: post-procedure vital signs reviewed and stable Respiratory status: spontaneous breathing, nonlabored ventilation, respiratory function stable and patient connected to nasal cannula oxygen Cardiovascular status: blood pressure returned to baseline and stable Postop Assessment: no apparent nausea or vomiting Anesthetic complications: no     Last Vitals:  Vitals:   12/31/16 1110 12/31/16 1120  BP: 110/70 (!) 122/55  Pulse: 73 (!) 57  Resp: 14 13  Temp:    SpO2: 95% 98%    Last Pain:  Vitals:   12/31/16 1050  TempSrc: Tympanic                 Molli Barrows

## 2017-01-15 ENCOUNTER — Ambulatory Visit (INDEPENDENT_AMBULATORY_CARE_PROVIDER_SITE_OTHER): Payer: BLUE CROSS/BLUE SHIELD | Admitting: Podiatry

## 2017-01-15 ENCOUNTER — Encounter: Payer: Self-pay | Admitting: Podiatry

## 2017-01-15 DIAGNOSIS — T847XXD Infection and inflammatory reaction due to other internal orthopedic prosthetic devices, implants and grafts, subsequent encounter: Secondary | ICD-10-CM | POA: Diagnosis not present

## 2017-01-15 DIAGNOSIS — L603 Nail dystrophy: Secondary | ICD-10-CM

## 2017-01-15 MED ORDER — TERBINAFINE HCL 250 MG PO TABS: 250.0000 mg | ORAL_TABLET | Freq: Every day | ORAL | 0 refills | Status: DC

## 2017-01-15 NOTE — Progress Notes (Signed)
Presents today for follow-up of her onychomycosis. She has taken 120 days +2 months additional. She states these appear to be growing out. She is also complaining of painful internal fixation to the right ankle from a bimalleolar ankle fracture several years ago.  Objective: 95% grown out to the worse toes. Pain on palpation to the fibula and the medial malleolus. Palpable plate and screws.  Assessment: Onychomycosis resolving with long-term Lamisil therapy. Painful internal fixation.  Plan: She will continue medication every other day for 2 months prescription was provided today follow up with her in the near future. We consented her today for removal of screws to the surgical sites. We'll also remove a plate. She understands should be wearing a Cam Walker after this. Follow up with her in the near future for surgery.

## 2017-01-15 NOTE — Patient Instructions (Signed)
Dr. Milinda Pointer has sent over a refill for Lamisil to your pharmacy today. The instructions on your bottle will say "take 1 tablet daily", however, he would like for you to take one pill every other day. He will follow up with you in 3 months to re-evaluate your toenails.   Pre-Operative Instructions  Congratulations, you have decided to take an important step towards improving your quality of life.  You can be assured that the doctors and staff at [facility] will be with you every step of the way.  Here are some important things you should know:  1. Plan to be at the surgery center/hospital at least 1 (one) hour prior to your scheduled time, unless otherwise directed by the surgical center/hospital staff.  You must have a responsible adult accompany you, remain during the surgery and drive you home.  Make sure you have directions to the surgical center/hospital to ensure you arrive on time. 2. If you are having surgery at [facility] or [facility], you will need a copy of your medical history and physical form from your family physician within one month prior to the date of surgery. We will give you a form for your primary physician to complete.  3. We make every effort to accommodate the date you request for surgery.  However, there are times where surgery dates or times have to be moved.  We will contact you as soon as possible if a change in schedule is required.   4. No aspirin/ibuprofen for one week before surgery.  If you are on aspirin, any non-steroidal anti-inflammatory medications (Mobic, Aleve, Ibuprofen) should not be taken seven (7) days prior to your surgery.  You make take Tylenol for pain prior to surgery.  5. Medications - If you are taking daily heart and blood pressure medications, seizure, reflux, allergy, asthma, anxiety, pain or diabetes medications, make sure you notify the surgery center/hospital before the day of surgery so they can tell you which medications you  should take or avoid the day of surgery. 6. No food or drink after midnight the night before surgery unless directed otherwise by surgical center/hospital staff. 7. No alcoholic beverages 06-CBJSE prior to surgery.  No smoking 24-hours prior or 24-hours after surgery. 8. Wear loose pants or shorts. They should be loose enough to fit over bandages, boots, and casts. 9. Don't wear slip-on shoes. Sneakers are preferred. 10. Bring your boot with you to the surgery center/hospital.  Also bring crutches or a walker if your physician has prescribed it for you.  If you do not have this equipment, it will be provided for you after surgery. 11. If you have not been contacted by the surgery center/hospital by the day before your surgery, call to confirm the date and time of your surgery. 12. Leave-time from work may vary depending on the type of surgery you have.  Appropriate arrangements should be made prior to surgery with your employer. 13. Prescriptions will be provided immediately following surgery by your doctor.  Fill these as soon as possible after surgery and take the medication as directed. Pain medications will not be refilled on weekends and must be approved by the doctor. 14. Remove nail polish on the operative foot and avoid getting pedicures prior to surgery. 15. Wash the night before surgery.  The night before surgery wash the foot and leg well with water and the antibacterial soap provided. Be sure to pay special attention to beneath the toenails and in between  the toes.  Wash for at least three (3) minutes. Rinse thoroughly with water and dry well with a towel.  Perform this wash unless told not to do so by your physician.  Enclosed: 1 Ice pack (please put in freezer the night before surgery)   1 Hibiclens skin cleaner   Pre-op instructions  If you have any questions regarding the instructions, please do not hesitate to call our office.  FOR SURGERY SCHEDULING, CONTACT OUR SURGICAL  COORDINATOR, DELYDIA MEADOWS IN OUR [place] OFFICE.   : [address], [place], [place] [address] -- [place]: [address] [place], [place] [address] -- [phone]  [place]: [place], [place] [address] -- [phone]  [place]: [address] [place], [place], [place] [address] -- [phone]  Website: [email or web address]

## 2017-01-16 ENCOUNTER — Telehealth: Payer: Self-pay | Admitting: *Deleted

## 2017-01-16 NOTE — Telephone Encounter (Signed)
"  I called you yesterday about setting up surgery with Dr. Milinda Pointer."  He can do it on November 9.  "That will be fine."  You can register with the surgical center now.  Someone from the surgical center will give you a call a day or two prior to the surgery date with the arrival time.    (I need the scheduling sheet and consent forms, please.)

## 2017-01-16 NOTE — Telephone Encounter (Signed)
"  Dr. Milinda Pointer told me to call you to schedule the hardware removal of my right ankle.  Please call me back."

## 2017-01-16 NOTE — Telephone Encounter (Signed)
"  I was to call you back to schedule surgery with Dr. Milinda Pointer.  If you could, call me back.  I'd appreciate it."

## 2017-01-22 ENCOUNTER — Telehealth: Payer: Self-pay | Admitting: *Deleted

## 2017-01-22 ENCOUNTER — Telehealth: Payer: Self-pay | Admitting: Podiatry

## 2017-01-22 NOTE — Telephone Encounter (Signed)
About a week if she sits at work.

## 2017-01-22 NOTE — Telephone Encounter (Signed)
"  Please return my call." 

## 2017-01-22 NOTE — Telephone Encounter (Signed)
Patient  Called is having surgery to remove, plate, pins, screw on 02/07/17. She wants to know how long she needs to plan to be out of work after this procedure. Please call to discuss.

## 2017-01-23 NOTE — Telephone Encounter (Signed)
Returned patient call and notified her of Dr. Marisa Cyphers advise.  She verbalized understanding and she said that will be ok

## 2017-01-24 NOTE — Telephone Encounter (Signed)
I returned her call.  She said someone had called her back and answered her questions.

## 2017-02-05 ENCOUNTER — Other Ambulatory Visit: Payer: Self-pay | Admitting: Podiatry

## 2017-02-05 MED ORDER — CEPHALEXIN 500 MG PO CAPS: 500.0000 mg | ORAL_CAPSULE | Freq: Three times a day (TID) | ORAL | 0 refills | Status: DC

## 2017-02-05 MED ORDER — HYDROMORPHONE HCL 4 MG PO TABS: 4.0000 mg | ORAL_TABLET | ORAL | 0 refills | Status: DC | PRN

## 2017-02-05 MED ORDER — PROMETHAZINE HCL 25 MG PO TABS: 25.0000 mg | ORAL_TABLET | Freq: Three times a day (TID) | ORAL | 0 refills | Status: DC | PRN

## 2017-02-07 ENCOUNTER — Other Ambulatory Visit: Payer: BLUE CROSS/BLUE SHIELD

## 2017-02-07 ENCOUNTER — Encounter: Payer: Self-pay | Admitting: Podiatry

## 2017-02-07 DIAGNOSIS — M25571 Pain in right ankle and joints of right foot: Secondary | ICD-10-CM | POA: Diagnosis not present

## 2017-02-07 DIAGNOSIS — Z472 Encounter for removal of internal fixation device: Secondary | ICD-10-CM | POA: Diagnosis not present

## 2017-02-07 DIAGNOSIS — Z4889 Encounter for other specified surgical aftercare: Secondary | ICD-10-CM | POA: Diagnosis not present

## 2017-02-07 DIAGNOSIS — M722 Plantar fascial fibromatosis: Secondary | ICD-10-CM | POA: Diagnosis not present

## 2017-02-07 DIAGNOSIS — Z01818 Encounter for other preprocedural examination: Secondary | ICD-10-CM | POA: Diagnosis not present

## 2017-02-07 DIAGNOSIS — T8484XA Pain due to internal orthopedic prosthetic devices, implants and grafts, initial encounter: Secondary | ICD-10-CM | POA: Diagnosis not present

## 2017-02-07 DIAGNOSIS — T8733 Neuroma of amputation stump, right lower extremity: Secondary | ICD-10-CM | POA: Diagnosis not present

## 2017-02-07 DIAGNOSIS — D2121 Benign neoplasm of connective and other soft tissue of right lower limb, including hip: Secondary | ICD-10-CM | POA: Diagnosis not present

## 2017-02-12 ENCOUNTER — Other Ambulatory Visit: Payer: Self-pay | Admitting: Podiatry

## 2017-02-12 ENCOUNTER — Ambulatory Visit (INDEPENDENT_AMBULATORY_CARE_PROVIDER_SITE_OTHER): Payer: BLUE CROSS/BLUE SHIELD | Admitting: Podiatry

## 2017-02-12 ENCOUNTER — Ambulatory Visit (INDEPENDENT_AMBULATORY_CARE_PROVIDER_SITE_OTHER): Payer: BLUE CROSS/BLUE SHIELD

## 2017-02-12 DIAGNOSIS — Z9889 Other specified postprocedural states: Secondary | ICD-10-CM

## 2017-02-12 DIAGNOSIS — M7751 Other enthesopathy of right foot: Secondary | ICD-10-CM

## 2017-02-12 NOTE — Progress Notes (Signed)
She presents today for removal of her internal plate and screws to the right ankle. She states she is doing very well has no pain with.  Objective: Vital signs are stable she's alert and oriented 3. Postural dressing intact was removed demonstrates no erythema is mild edema no cellulitis drainage or odor sutures are placed large and well coapted.  Assessment: Surgical foot right.  Plan: [place] today just a compressive dressing follow-up with me in 1 week for suture removal. Continue use of the Cam Walker.

## 2017-02-14 ENCOUNTER — Encounter: Payer: Self-pay | Admitting: Podiatry

## 2017-02-17 ENCOUNTER — Encounter: Payer: Self-pay | Admitting: Podiatry

## 2017-02-24 ENCOUNTER — Encounter: Payer: Self-pay | Admitting: Podiatry

## 2017-02-24 ENCOUNTER — Ambulatory Visit (INDEPENDENT_AMBULATORY_CARE_PROVIDER_SITE_OTHER): Payer: BLUE CROSS/BLUE SHIELD | Admitting: Podiatry

## 2017-02-24 DIAGNOSIS — T847XXD Infection and inflammatory reaction due to other internal orthopedic prosthetic devices, implants and grafts, subsequent encounter: Secondary | ICD-10-CM | POA: Diagnosis not present

## 2017-02-24 NOTE — Progress Notes (Signed)
She presents today for follow-up of removal painful internal fixation right foot. She states that she is doing very well. Denies fever chills nausea vomiting muscle aches and pains she states that she will be excited to get the stitches out so she can get a shower.  Objective: Vital signs are stable alert and oriented 3. Pulses are palpable. Dressing removed demonstrates no drainage no erythema cellulitis drainage or odor Celesta Aver are intact margins are well coapted. The edges were removed margins remain well coapted.  Assessment: Well-healing surgical ankle right  Plan: Sutures were removed today placed her compression anklet after Steri-Strips were placed across the wounds. I will allow her to start getting this wet later today and she will follow up with me in a couple of weeks. She'll continue to wear the Cam Walker for at least another week or 2.

## 2017-03-10 ENCOUNTER — Encounter: Payer: BLUE CROSS/BLUE SHIELD | Admitting: Podiatry

## 2017-04-28 ENCOUNTER — Encounter: Payer: Self-pay | Admitting: Podiatry

## 2017-04-28 ENCOUNTER — Ambulatory Visit (INDEPENDENT_AMBULATORY_CARE_PROVIDER_SITE_OTHER): Payer: BLUE CROSS/BLUE SHIELD | Admitting: Podiatry

## 2017-04-28 DIAGNOSIS — T847XXD Infection and inflammatory reaction due to other internal orthopedic prosthetic devices, implants and grafts, subsequent encounter: Secondary | ICD-10-CM

## 2017-04-28 NOTE — Progress Notes (Signed)
She presents today for follow-up of her removal of painful hardware right ankle.  She states that is doing just great of had no problems with it.  She states that it feels so much better now than it did prior to removing it and I am so happy.  I feel that my range of motion is much better and I am no longer walk with a limp.  Objective: Vital signs stable she is alert and oriented x3.  Incision site is gone on to heal uneventfully there is no erythema edema cellulitis drainage or odor.  She has great range of motion.  Nail plates have gone on to heal 100% no longer demonstrating onychomycosis.  Assessment: Well-healing surgical ankle and leg right.  Well-healed onychomycosis.  Plan: Follow-up with me for any questions.

## 2017-05-02 NOTE — Progress Notes (Signed)
Dos 02/07/17 Removal internal fixation Rt ankle

## 2017-12-09 ENCOUNTER — Other Ambulatory Visit: Payer: Self-pay | Admitting: Medical

## 2017-12-29 ENCOUNTER — Other Ambulatory Visit: Payer: Self-pay | Admitting: Physician Assistant

## 2017-12-29 DIAGNOSIS — F4329 Adjustment disorder with other symptoms: Secondary | ICD-10-CM

## 2017-12-29 NOTE — Telephone Encounter (Signed)
Patient scheduled for a CPE on 01/15/18

## 2017-12-29 NOTE — Telephone Encounter (Signed)
Patient due for CPE 

## 2018-01-15 ENCOUNTER — Encounter: Payer: Self-pay | Admitting: Physician Assistant

## 2018-01-15 ENCOUNTER — Ambulatory Visit (INDEPENDENT_AMBULATORY_CARE_PROVIDER_SITE_OTHER): Payer: BLUE CROSS/BLUE SHIELD | Admitting: Physician Assistant

## 2018-01-15 ENCOUNTER — Other Ambulatory Visit (HOSPITAL_COMMUNITY)
Admission: RE | Admit: 2018-01-15 | Discharge: 2018-01-15 | Disposition: A | Discharge status: Home / Self Care | Payer: BLUE CROSS/BLUE SHIELD | Source: Ambulatory Visit | Attending: Physician Assistant | Admitting: Physician Assistant

## 2018-01-15 VITALS — BP 138/80 | HR 68 | Temp 98.2°F | Resp 16 | Ht 62.0 in | Wt 168.2 lb

## 2018-01-15 DIAGNOSIS — Z0000 Encounter for general adult medical examination without abnormal findings: Secondary | ICD-10-CM | POA: Insufficient documentation

## 2018-01-15 DIAGNOSIS — Z1272 Encounter for screening for malignant neoplasm of vagina: Secondary | ICD-10-CM | POA: Diagnosis not present

## 2018-01-15 DIAGNOSIS — Z114 Encounter for screening for human immunodeficiency virus [HIV]: Secondary | ICD-10-CM | POA: Insufficient documentation

## 2018-01-15 DIAGNOSIS — E0590 Thyrotoxicosis, unspecified without thyrotoxic crisis or storm: Secondary | ICD-10-CM | POA: Insufficient documentation

## 2018-01-15 DIAGNOSIS — Z136 Encounter for screening for cardiovascular disorders: Secondary | ICD-10-CM | POA: Diagnosis not present

## 2018-01-15 DIAGNOSIS — Z13220 Encounter for screening for lipoid disorders: Secondary | ICD-10-CM | POA: Insufficient documentation

## 2018-01-15 DIAGNOSIS — Z131 Encounter for screening for diabetes mellitus: Secondary | ICD-10-CM

## 2018-01-15 DIAGNOSIS — Z1239 Encounter for other screening for malignant neoplasm of breast: Secondary | ICD-10-CM

## 2018-01-15 DIAGNOSIS — J301 Allergic rhinitis due to pollen: Secondary | ICD-10-CM

## 2018-01-15 DIAGNOSIS — Z6830 Body mass index (BMI) 30.0-30.9, adult: Secondary | ICD-10-CM

## 2018-01-15 MED ORDER — FLUTICASONE PROPIONATE 50 MCG/ACT NA SUSP: 2.0000 | Freq: Every day | NASAL | 6 refills | Status: DC

## 2018-01-15 NOTE — Progress Notes (Signed)
Patient: Dana Jarvis, Female    DOB: September 04, 1965, 52 y.o.   MRN: [id number] Visit Date: 01/15/2018  Today's Provider: Mar Daring, PA-C   No chief complaint on file.  Subjective:    Annual physical exam Dana Jarvis is a 52 y.o. female who presents today for health maintenance and complete physical. She feels well. She reports exercising. She reports she is sleeping well.  Last CPE:11/29/16 Mammogram:12/06/16 Normal Pap:01/26/14 Normal, HPV-Neg Tdap: given at [facility] Colonoscopy: 12/31/16-Dr. Allen Norris, normal, repeat in 10 years -----------------------------------------------------------------   Review of Systems  Constitutional: Negative.   HENT: Negative.   Eyes: Negative.   Respiratory: Negative.   Cardiovascular: Negative.   Gastrointestinal: Negative.   Endocrine: Negative.   Genitourinary: Negative.   Musculoskeletal: Negative.   Skin: Negative.   Allergic/Immunologic: Negative.   Neurological: Negative.   Hematological: Negative.   Psychiatric/Behavioral: Negative.     Social History      She  reports that she has never smoked. She has never used smokeless tobacco. She reports that she drinks about 4.0 standard drinks of alcohol per week. She reports that she does not use drugs.       Social History   Socioeconomic History  . Marital status: Widowed    Spouse name: Not on file  . Number of children: Not on file  . Years of education: Not on file  . Highest education level: Not on file  Occupational History  . Not on file  Social Needs  . Financial resource strain: Not on file  . Food insecurity:    Worry: Not on file    Inability: Not on file  . Transportation needs:    Medical: Not on file    Non-medical: Not on file  Tobacco Use  . Smoking status: Never Smoker  . Smokeless tobacco: Never Used  Substance and Sexual Activity  . Alcohol use: Yes    Alcohol/week: 4.0 standard drinks    Types: 4 Glasses of wine per week   Comment: occasional  . Drug use: No  . Sexual activity: Not on file  Lifestyle  . Physical activity:    Days per week: Not on file    Minutes per session: Not on file  . Stress: Not on file  Relationships  . Social connections:    Talks on phone: Not on file    Gets together: Not on file    Attends religious service: Not on file    Active member of club or organization: Not on file    Attends meetings of clubs or organizations: Not on file    Relationship status: Not on file  Other Topics Concern  . Not on file  Social History Narrative  . Not on file    Past Medical History:  Diagnosis Date  . Arthritis    hands, feet  . Complication of anesthesia    has awakened during procedures  . GERD (gastroesophageal reflux disease)   . Headache    sinus  . Motion sickness    ocean ships  . Seasonal allergies   . Wears contact lenses      Patient Active Problem List   Diagnosis Date Noted  . Screening for colon cancer   . Arthritis 04/05/2015  . Acid reflux 04/05/2015  . Blood in the urine 04/05/2015  . Cannot sleep 04/05/2015  . Adiposity 04/05/2015  . Shortened PR interval 04/05/2015  . Hyperthyroidism, subclinical 01/17/2015  . Snores 01/17/2015  .  Blood pressure elevated without history of HTN 09/26/2009  . Adaptation reaction 10/28/2007  . Alcohol drinker 10/28/2007  . Allergic rhinitis 10/28/2007    Past Surgical History:  Procedure Laterality Date  . ABDOMINAL HYSTERECTOMY  2005   with bilateral salpingo-oophorectomy  . CESAREAN SECTION  1988  . COLONOSCOPY WITH PROPOFOL N/A 12/31/2016   Procedure: COLONOSCOPY WITH PROPOFOL;  Surgeon: [name], MD;  [facility] END[facility]COPY;  Service: Endoscopy;  Laterality: N/A;  . FRACTURE SURGERY Right 2007   ankle  . TONSILLECTOMY    . WRIST SURGERY Left    ganglion    Family History        Family Status  Relation Name Status  . Mother  Deceased at age 44  . Father  Deceased at age 73  . Brother  Alive    . Sister  Alive        Her family history includes Arthritis in her mother; Cancer in her father and mother; Glaucoma in her mother; Healthy in her sister; Hypertension in her brother.      Allergies  Allergen Reactions  . Latex Rash  . Sulfa Antibiotics Rash     Current Outpatient Medications:  .  Cetirizine HCl (ZYRTEC ALLERGY) 10 MG CAPS, Take by mouth., Disp: , Rfl:  .  Cholecalciferol (D3 DOTS) 2000 UNITS TBDP, Take by mouth., Disp: , Rfl:  .  Cobalamine Combinations (B-12) [phone] MCG SUBL, B12, Disp: , Rfl:  .  escitalopram (LEXAPRO) 10 MG tablet, TAKE ONE TABLET DAILY, Disp: 30 tablet, Rfl: 0 .  fluticasone (FLONASE) 50 MCG/ACT nasal spray, Place 2 sprays into both nostrils daily., Disp: 16 g, Rfl: 6 .  ibuprofen (ADVIL,MOTRIN) 200 MG tablet, Take 400 mg by mouth every 6 (six) hours as needed., Disp: , Rfl:    Patient Care Team: Mar Daring, PA-C as PCP - General (Family Medicine)      Objective:   Vitals: BP 138/80 (BP Location: Left Arm, Patient Position: Sitting, Cuff Size: Normal)   Pulse 68   Temp 98.2 F (36.8 C) (Oral)   Resp 16   Ht 5\' 2"  (1.575 m)   Wt 168 lb 3.2 oz (76.3 kg)   LMP  (LMP Unknown)   SpO2 98%   BMI 30.76 kg/m    Vitals:   01/15/18 1017  BP: 138/80  Pulse: 68  Resp: 16  Temp: 98.2 F (36.8 C)  TempSrc: Oral  SpO2: 98%  Weight: 168 lb 3.2 oz (76.3 kg)  Height: 5\' 2"  (1.575 m)     Physical Exam  Constitutional: She is oriented to person, place, and time. She appears well-developed and well-nourished. No distress.  HENT:  Head: Normocephalic and atraumatic.  Right Ear: Hearing, tympanic membrane, external ear and ear canal normal.  Left Ear: Hearing, tympanic membrane, external ear and ear canal normal.  Nose: Nose normal.  Mouth/Throat: Uvula is midline, oropharynx is clear and moist and mucous membranes are normal. No oropharyngeal exudate.  Eyes: Pupils are equal, round, and reactive to light. Conjunctivae and EOM  are normal. Right eye exhibits no discharge. Left eye exhibits no discharge. No scleral icterus.  Neck: Normal range of motion. Neck supple. No JVD present. Carotid bruit is not present. No tracheal deviation present. No thyromegaly present.  Cardiovascular: Normal rate, regular rhythm, normal heart sounds and intact distal pulses. Exam reveals no gallop and no friction rub.  No murmur heard. Pulmonary/Chest: Effort normal and breath sounds normal. No respiratory distress. She has no  wheezes. She has no rales. She exhibits no tenderness. Right breast exhibits no inverted nipple, no mass, no nipple discharge, no skin change and no tenderness. Left breast exhibits no inverted nipple, no mass, no nipple discharge, no skin change and no tenderness. No breast tenderness, discharge or bleeding. Breasts are symmetrical.  Abdominal: Soft. Bowel sounds are normal. She exhibits no distension and no mass. There is no tenderness. There is no rebound and no guarding. Hernia confirmed negative in the right inguinal area and confirmed negative in the left inguinal area.  Genitourinary: Rectum normal and vagina normal. No breast tenderness, discharge or bleeding. Pelvic exam was performed with patient supine. There is no rash, tenderness, lesion or injury on the right labia. There is no rash, tenderness, lesion or injury on the left labia. Right adnexum displays no mass, no tenderness and no fullness. Left adnexum displays no mass, no tenderness and no fullness. No erythema, tenderness or bleeding in the vagina. No signs of injury around the vagina. No vaginal discharge found.  Genitourinary Comments: Cervix and Uterus surgically absent  Musculoskeletal: Normal range of motion. She exhibits no edema or tenderness.  Lymphadenopathy:    She has no cervical adenopathy.       Right: No inguinal adenopathy present.       Left: No inguinal adenopathy present.  Neurological: She is alert and oriented to person, place, and  time. She has normal reflexes. No cranial nerve deficit. Coordination normal.  Skin: Skin is warm and dry. No rash noted. She is not diaphoretic.  Psychiatric: She has a normal mood and affect. Her behavior is normal. Judgment and thought content normal.  Vitals reviewed.    Depression Screen PHQ 2/9 Scores 01/15/2018 01/15/2018 11/29/2016 04/06/2015  PHQ - 2 Score 0 0 0 0      Assessment & Plan:     Routine Health Maintenance and Physical Exam  Exercise Activities and Dietary recommendations Goals    . Exercise 150 minutes per week (moderate activity)       Immunization History  Administered Date(s) Administered  . Influenza,inj,Quad PF,6+ Mos 12/21/2013  . Influenza-Unspecified 12/23/2016, 01/09/2018  . Td 04/01/1988    Health Maintenance  Topic Date Due  . HIV Screening  03/15/1979  . TETANUS/TDAP  04/01/1998  . PAP SMEAR  01/26/2017  . MAMMOGRAM  12/07/2018  . COLONOSCOPY  01/01/2027  . INFLUENZA VACCINE  Completed  . Hepatitis C Screening  Completed     Discussed health benefits of physical activity, and encouraged her to engage in regular exercise appropriate for her age and condition.    1. Annual physical exam Normal physical exam today. Will check labs as below and f/u pending lab results. If labs are stable and WNL she will not need to have these rechecked for one year at her next annual physical exam. She is to call the office in the meantime if she has any acute issue, questions or concerns. - CBC with Differential/Platelet - Comprehensive metabolic panel  2. Breast cancer screening 10 Relaxation Techniques That Zap Stress Fast By Tomasa Hosteller Moninger   Listen  Relax. You deserve it, it's good for you, and it takes less time than you think. You don't need a spa weekend or a retreat. Each of these stress-relieving tips can get you from OMG to om in less than 15 minutes. 1. Meditate  A few minutes of practice per day can help ease anxiety. "Research  suggests that daily meditation may alter the brain's neural pathways,  making you more resilient to stress," says psychologist Vinson Moselle, PhD, a [place] and wellness coach. It's simple. Sit up straight with both feet on the floor. Close your eyes. Focus your attention on reciting -- out loud or silently -- a positive mantra such as "I feel at peace" or "I love myself." Place one hand on your belly to sync the mantra with your breaths. Let any distracting thoughts float by like clouds. 2. Breathe Deeply  Take a 5-minute break and focus on your breathing. Sit up straight, eyes closed, with a hand on your belly. Slowly inhale through your nose, feeling the breath start in your abdomen and work its way to the top of your head. Reverse the process as you exhale through your mouth.  "Deep breathing counters the effects of stress by slowing the heart rate and lowering blood pressure," psychologist Verdene Rio, PhD, says. She's a certified life coach in [place], [place] 3. Be Present  Slow down.  "Take 5 minutes and focus on only one behavior with awareness," Serena Colonel says. Notice how the air feels on your face when you're walking and how your feet feel hitting the ground. Enjoy the texture and taste of each bite of food. When you spend time in the moment and focus on your senses, you should feel less tense. 4. Reach Out  Your social network is one of your best tools for handling stress. Talk to others -- preferably face to face, or at least on the phone. Share what's going on. You can get a fresh perspective while keeping your connection strong. 5. Tune In to Your Body  Mentally scan your body to get a sense of how stress affects it each day. Lie on your back, or sit with your feet on the floor. Start at your toes and work your way up to your scalp, noticing how your body feels.  10 Relaxation Techniques That Zap Stress Fast By Barrie Folk   Listen  "Simply be aware of places you feel tight  or loose without trying to change anything," Serena Colonel says. For 1 to 2 minutes, imagine each deep breath flowing to that body part. Repeat this process as you move your focus up your body, paying close attention to sensations you feel in each body part. 6. Decompress  Place a warm heat wrap around your neck and shoulders for 10 minutes. Close your eyes and relax your face, neck, upper chest, and back muscles. Remove the wrap, and use a tennis ball or foam roller to massage away tension.  "Place the ball between your back and the wall. Lean into the ball, and hold gentle pressure for up to 15 seconds. Then move the ball to another spot, and apply pressure," says Derenda Mis, a nurse practitioner and assistant professor at [employer] in [place]. 7. Laugh Out Loud  A good belly laugh doesn't just lighten the load mentally. It lowers cortisol, your body's stress hormone, and boosts brain chemicals called endorphins, which help your mood. Lighten up by tuning in to your favorite sitcom or video, reading the comics, or chatting with someone who makes you smile. 8. Crank Up the [place] shows that listening to soothing music can lower blood pressure, heart rate, and anxiety. "Create a playlist of songs or nature sounds (the ocean, a bubbling brook, birds chirping), and allow your mind to focus on the different melodies, instruments, or singers in the piece," Benninger says. You also can  blow off steam by rocking out to more upbeat tunes -- or singing at the top of your lungs! 9. Get Moving  You don't have to run in order to get a runner's high. All forms of exercise, including yoga and walking, can ease depression and anxiety by helping the brain release feel-good chemicals and by giving your body a chance to practice dealing with stress. You can go for a quick walk around the block, take the stairs up and down a few flights, or do some stretching exercises like head  rolls and shoulder shrugs. 10. Be Grateful  Keep a gratitude journal or several (one by your bed, one in your purse, and one at work) to help you remember all the things that are good in your life.  "Being grateful for your blessings cancels out negative thoughts and worries," says Joni Emmerling, a wellness coach in [place], [place].  Use these journals to savor good experiences like a child's smile, a sunshine-filled day, and good health. Don't forget to celebrate accomplishments like mastering a new task at work or a new hobby. When you start feeling stressed, spend a few minutes looking through your notes to remind yourself what really matters. - MM 3D SCREEN BREAST BILATERAL; Future  3. Screening for vaginal cancer Pap collected today. Will send as below and f/u pending results. - Cytology - PAP  4. Hyperthyroidism, subclinical Asymptomatic. Will check labs as below and f/u pending results. - TSH  5. Encounter for lipid screening for cardiovascular disease Will check labs as below and f/u pending results. - Lipid panel  6. Diabetes mellitus screening Will check labs as below and f/u pending results. - Hemoglobin A1c  7. Chronic seasonal allergic rhinitis due to pollen Stable. Diagnosis pulled for medication refill. Continue current medical treatment plan. - fluticasone (FLONASE) 50 MCG/ACT nasal spray; Place 2 sprays into both nostrils daily.  Dispense: 16 g; Refill: 6  8. BMI 30.0-30.9,adult Counseled patient on healthy lifestyle modifications including dieting and exercise.   9. Encounter for screening for HIV Will check labs as below and f/u pending results. - HIV Antibody (routine testing w rflx)  --------------------------------------------------------------------    Mar Daring, PA-C  [place]

## 2018-01-15 NOTE — Patient Instructions (Signed)
Health Maintenance for Postmenopausal Women Menopause is a normal process in which your reproductive ability comes to an end. This process happens gradually over a span of months to years, usually between the ages of 22 and 9. Menopause is complete when you have missed 12 consecutive menstrual periods. It is important to talk with your health care provider about some of the most common conditions that affect postmenopausal women, such as heart disease, cancer, and bone loss (osteoporosis). Adopting a healthy lifestyle and getting preventive care can help to promote your health and wellness. Those actions can also lower your chances of developing some of these common conditions. What should I know about menopause? During menopause, you may experience a number of symptoms, such as:  Moderate-to-severe hot flashes.  Night sweats.  Decrease in sex drive.  Mood swings.  Headaches.  Tiredness.  Irritability.  Memory problems.  Insomnia.  Choosing to treat or not to treat menopausal changes is an individual decision that you make with your health care provider. What should I know about hormone replacement therapy and supplements? Hormone therapy products are effective for treating symptoms that are associated with menopause, such as hot flashes and night sweats. Hormone replacement carries certain risks, especially as you become older. If you are thinking about using estrogen or estrogen with progestin treatments, discuss the benefits and risks with your health care provider. What should I know about heart disease and stroke? Heart disease, heart attack, and stroke become more likely as you age. This may be due, in part, to the hormonal changes that your body experiences during menopause. These can affect how your body processes dietary fats, triglycerides, and cholesterol. Heart attack and stroke are both medical emergencies. There are many things that you can do to help prevent heart disease  and stroke:  Have your blood pressure checked at least every 1-2 years. High blood pressure causes heart disease and increases the risk of stroke.  If you are 53-22 years old, ask your health care provider if you should take aspirin to prevent a heart attack or a stroke.  Do not use any tobacco products, including cigarettes, chewing tobacco, or electronic cigarettes. If you need help quitting, ask your health care provider.  It is important to eat a healthy diet and maintain a healthy weight. ? Be sure to include plenty of vegetables, fruits, low-fat dairy products, and lean protein. ? Avoid eating foods that are high in solid fats, added sugars, or salt (sodium).  Get regular exercise. This is one of the most important things that you can do for your health. ? Try to exercise for at least 150 minutes each week. The type of exercise that you do should increase your heart rate and make you sweat. This is known as moderate-intensity exercise. ? Try to do strengthening exercises at least twice each week. Do these in addition to the moderate-intensity exercise.  Know your numbers.Ask your health care provider to check your cholesterol and your blood glucose. Continue to have your blood tested as directed by your health care provider.  What should I know about cancer screening? There are several types of cancer. Take the following steps to reduce your risk and to catch any cancer development as early as possible. Breast Cancer  Practice breast self-awareness. ? This means understanding how your breasts normally appear and feel. ? It also means doing regular breast self-exams. Let your health care provider know about any changes, no matter how small.  If you are 40  or older, have a clinician do a breast exam (clinical breast exam or CBE) every year. Depending on your age, family history, and medical history, it may be recommended that you also have a yearly breast X-ray (mammogram).  If you  have a family history of breast cancer, talk with your health care provider about genetic screening.  If you are at high risk for breast cancer, talk with your health care provider about having an MRI and a mammogram every year.  Breast cancer (BRCA) gene test is recommended for women who have family members with BRCA-related cancers. Results of the assessment will determine the need for genetic counseling and BRCA1 and for BRCA2 testing. BRCA-related cancers include these types: ? Breast. This occurs in males or females. ? Ovarian. ? Tubal. This may also be called fallopian tube cancer. ? Cancer of the abdominal or pelvic lining (peritoneal cancer). ? Prostate. ? Pancreatic.  Cervical, Uterine, and Ovarian Cancer Your health care provider may recommend that you be screened regularly for cancer of the pelvic organs. These include your ovaries, uterus, and vagina. This screening involves a pelvic exam, which includes checking for microscopic changes to the surface of your cervix (Pap test).  For women ages 21-65, health care providers may recommend a pelvic exam and a Pap test every three years. For women ages 79-65, they may recommend the Pap test and pelvic exam, combined with testing for human papilloma virus (HPV), every five years. Some types of HPV increase your risk of cervical cancer. Testing for HPV may also be done on women of any age who have unclear Pap test results.  Other health care providers may not recommend any screening for nonpregnant women who are considered low risk for pelvic cancer and have no symptoms. Ask your health care provider if a screening pelvic exam is right for you.  If you have had past treatment for cervical cancer or a condition that could lead to cancer, you need Pap tests and screening for cancer for at least 20 years after your treatment. If Pap tests have been discontinued for you, your risk factors (such as having a new sexual partner) need to be  reassessed to determine if you should start having screenings again. Some women have medical problems that increase the chance of getting cervical cancer. In these cases, your health care provider may recommend that you have screening and Pap tests more often.  If you have a family history of uterine cancer or ovarian cancer, talk with your health care provider about genetic screening.  If you have vaginal bleeding after reaching menopause, tell your health care provider.  There are currently no reliable tests available to screen for ovarian cancer.  Lung Cancer Lung cancer screening is recommended for adults 69-62 years old who are at high risk for lung cancer because of a history of smoking. A yearly low-dose CT scan of the lungs is recommended if you:  Currently smoke.  Have a history of at least 30 pack-years of smoking and you currently smoke or have quit within the past 15 years. A pack-year is smoking an average of one pack of cigarettes per day for one year.  Yearly screening should:  Continue until it has been 15 years since you quit.  Stop if you develop a health problem that would prevent you from having lung cancer treatment.  Colorectal Cancer  This type of cancer can be detected and can often be prevented.  Routine colorectal cancer screening usually begins at  age 75 and continues through age 26.  If you have risk factors for colon cancer, your health care provider may recommend that you be screened at an earlier age.  If you have a family history of colorectal cancer, talk with your health care provider about genetic screening.  Your health care provider may also recommend using home test kits to check for hidden blood in your stool.  A small camera at the end of a tube can be used to examine your colon directly (sigmoidoscopy or colonoscopy). This is done to check for the earliest forms of colorectal cancer.  Direct examination of the colon should be repeated every  5-10 years until age 71. However, if early forms of precancerous polyps or small growths are found or if you have a family history or genetic risk for colorectal cancer, you may need to be screened more often.  Skin Cancer  Check your skin from head to toe regularly.  Monitor any moles. Be sure to tell your health care provider: ? About any new moles or changes in moles, especially if there is a change in a mole's shape or color. ? If you have a mole that is larger than the size of a pencil eraser.  If any of your family members has a history of skin cancer, especially at a young age, talk with your health care provider about genetic screening.  Always use sunscreen. Apply sunscreen liberally and repeatedly throughout the day.  Whenever you are outside, protect yourself by wearing long sleeves, pants, a wide-brimmed hat, and sunglasses.  What should I know about osteoporosis? Osteoporosis is a condition in which bone destruction happens more quickly than new bone creation. After menopause, you may be at an increased risk for osteoporosis. To help prevent osteoporosis or the bone fractures that can happen because of osteoporosis, the following is recommended:  If you are 33-61 years old, get at least 1,000 mg of calcium and at least 600 mg of vitamin D per day.  If you are older than age 2 but younger than age 68, get at least 1,200 mg of calcium and at least 600 mg of vitamin D per day.  If you are older than age 42, get at least 1,200 mg of calcium and at least 800 mg of vitamin D per day.  Smoking and excessive alcohol intake increase the risk of osteoporosis. Eat foods that are rich in calcium and vitamin D, and do weight-bearing exercises several times each week as directed by your health care provider. What should I know about how menopause affects my mental health? Depression may occur at any age, but it is more common as you become older. Common symptoms of depression  include:  Low or sad mood.  Changes in sleep patterns.  Changes in appetite or eating patterns.  Feeling an overall lack of motivation or enjoyment of activities that you previously enjoyed.  Frequent crying spells.  Talk with your health care provider if you think that you are experiencing depression. What should I know about immunizations? It is important that you get and maintain your immunizations. These include:  Tetanus, diphtheria, and pertussis (Tdap) booster vaccine.  Influenza every year before the flu season begins.  Pneumonia vaccine.  Shingles vaccine.  Your health care provider may also recommend other immunizations. This information is not intended to replace advice given to you by your health care provider. Make sure you discuss any questions you have with your health care provider. Document Released: 05/10/2005  Document Revised: 10/06/2015 Document Reviewed: 12/20/2014 Elsevier Interactive Patient Education  2018 [employer]  

## 2018-01-21 LAB — CYTOLOGY - PAP
Diagnosis: NEGATIVE
HPV 16/18/45 genotyping: NEGATIVE
HPV: DETECTED — AB

## 2018-01-29 ENCOUNTER — Other Ambulatory Visit: Payer: Self-pay | Admitting: Physician Assistant

## 2018-01-29 DIAGNOSIS — F4329 Adjustment disorder with other symptoms: Secondary | ICD-10-CM

## 2018-02-17 ENCOUNTER — Encounter: Payer: Self-pay | Admitting: Medical

## 2018-02-17 ENCOUNTER — Ambulatory Visit: Payer: BLUE CROSS/BLUE SHIELD | Admitting: Medical

## 2018-02-17 VITALS — BP 141/77 | HR 65 | Temp 97.8°F | Resp 16 | Wt 163.6 lb

## 2018-02-17 DIAGNOSIS — J029 Acute pharyngitis, unspecified: Secondary | ICD-10-CM

## 2018-02-17 LAB — POCT RAPID STREP A (OFFICE): Rapid Strep A Screen: NEGATIVE

## 2018-02-17 NOTE — Patient Instructions (Signed)
Pharyngitis Pharyngitis is a sore throat (pharynx). There is redness, pain, and swelling of your throat. Follow these instructions at home:  Drink enough fluids to keep your pee (urine) clear or pale yellow.  Only take medicine as told by your doctor. ? You may get sick again if you do not take medicine as told. Finish your medicines, even if you start to feel better. ? Do not take aspirin.  Rest.  Rinse your mouth (gargle) with salt water ( tsp of salt per 1 qt of water) every 1-2 hours. This will help the pain.  If you are not at risk for choking, you can suck on hard candy or sore throat lozenges. Contact a doctor if:  You have large, tender lumps on your neck.  You have a rash.  You cough up green, yellow-[place], or bloody spit. Get help right away if:  You have a stiff neck.  You drool or cannot swallow liquids.  You throw up (vomit) or are not able to keep medicine or liquids down.  You have very bad pain that does not go away with medicine.  You have problems breathing (not from a stuffy nose). This information is not intended to replace advice given to you by your health care provider. Make sure you discuss any questions you have with your health care provider. Document Released: 09/04/2007 Document Revised: 08/24/2015 Document Reviewed: 11/23/2012 Elsevier Interactive Patient Education  2017 [employer]  

## 2018-02-17 NOTE — Progress Notes (Signed)
   Subjective:    Patient ID: Dana Jarvis, female    DOB: Oct 01, 1965, 52 y.o.   MRN: [id number]  HPI  52 yo in non acute distress. Sore throat x 1 day, headache, no fever, some chills (" could be because I am cold"). Grandson with strep throat diagnoised yesterday. Last dose of Motrin 600 mg at 6:45 am.   Blood pressure (!) 141/77, pulse 65, temperature 97.8 F (36.6 C), temperature source Tympanic, resp. rate 16, weight 163 lb 9.6 oz (74.2 kg), SpO2 98 %.  Allergies  Allergen Reactions  . Latex Rash  . Sulfa Antibiotics Rash   Review of Systems  Constitutional: Positive for chills and fatigue (not working out x 3 days). Negative for fever.  HENT: Positive for ear pain (not pain but aching) and sore throat. Negative for congestion, postnasal drip, rhinorrhea, sinus pressure, sinus pain and sneezing.   Eyes: Negative for discharge.  Respiratory: Negative for cough and shortness of breath.   Cardiovascular: Negative for chest pain.  Gastrointestinal: Negative for abdominal pain.  Endocrine: Negative for polydipsia, polyphagia and polyuria.  Genitourinary: Negative for dysuria.  Musculoskeletal: Negative for myalgias.  Skin: Negative for rash.  Allergic/Immunologic: Positive for environmental allergies. Negative for food allergies.  Neurological: Positive for headaches (back of head). Negative for dizziness, syncope and light-headedness.  Hematological: Negative for adenopathy.  Psychiatric/Behavioral: Negative for behavioral problems, confusion, self-injury and suicidal ideas.       Objective:   Physical Exam  Constitutional: She is oriented to person, place, and time. She appears well-developed and well-nourished.  HENT:  Head: Normocephalic and atraumatic.  Right Ear: Hearing and ear canal normal.  Left Ear: Hearing and ear canal normal.  Mouth/Throat: Uvula is midline and mucous membranes are normal. Posterior oropharyngeal edema and posterior oropharyngeal erythema  (uvula) present.  Eyes: Pupils are equal, round, and reactive to light. EOM are normal.  Neck: Normal range of motion. Neck supple.  Cardiovascular: Normal rate, regular rhythm and normal heart sounds.  Pulmonary/Chest: Effort normal and breath sounds normal.  Lymphadenopathy:    She has no cervical adenopathy.  Neurological: She is alert and oriented to person, place, and time.  Skin: Skin is warm and dry.  Psychiatric: She has a normal mood and affect. Her behavior is normal.  Nursing note and vitals reviewed.    Strep test Negative     Assessment & Plan:  Pharyngtis Dilute salt water gargles. OTC Motrin  As needed for pain per package directions. Rest, increase fluids. Follow up with your doctor in 3-5 days if not improving. Reviewed elevated blood pressure, denies any decongestants. If worsening in  3-5 days you may call and we will call you in an antibiotic. Patient verbalizes understanding and has no questions at discharge.

## 2018-03-12 ENCOUNTER — Other Ambulatory Visit: Payer: Self-pay | Admitting: Physician Assistant

## 2018-03-12 ENCOUNTER — Ambulatory Visit
Admission: RE | Admit: 2018-03-12 | Discharge: 2018-03-12 | Disposition: A | Discharge status: Home / Self Care | Payer: BLUE CROSS/BLUE SHIELD | Source: Ambulatory Visit | Attending: Physician Assistant | Admitting: Physician Assistant

## 2018-03-12 DIAGNOSIS — Z1239 Encounter for other screening for malignant neoplasm of breast: Secondary | ICD-10-CM | POA: Insufficient documentation

## 2018-03-12 DIAGNOSIS — Z1231 Encounter for screening mammogram for malignant neoplasm of breast: Secondary | ICD-10-CM | POA: Diagnosis not present

## 2018-03-12 DIAGNOSIS — R928 Other abnormal and inconclusive findings on diagnostic imaging of breast: Secondary | ICD-10-CM

## 2018-03-27 ENCOUNTER — Ambulatory Visit
Admission: RE | Admit: 2018-03-27 | Discharge: 2018-03-27 | Disposition: A | Discharge status: Home / Self Care | Payer: BLUE CROSS/BLUE SHIELD | Source: Ambulatory Visit | Attending: Physician Assistant | Admitting: Physician Assistant

## 2018-03-27 DIAGNOSIS — R928 Other abnormal and inconclusive findings on diagnostic imaging of breast: Secondary | ICD-10-CM | POA: Diagnosis not present

## 2018-03-27 DIAGNOSIS — R922 Inconclusive mammogram: Secondary | ICD-10-CM | POA: Diagnosis not present

## 2018-06-04 ENCOUNTER — Encounter: Payer: Self-pay | Admitting: Nurse Practitioner

## 2018-06-04 ENCOUNTER — Ambulatory Visit: Payer: BLUE CROSS/BLUE SHIELD | Admitting: Nurse Practitioner

## 2018-06-04 ENCOUNTER — Other Ambulatory Visit: Payer: Self-pay

## 2018-06-04 VITALS — BP 129/79 | HR 66 | Temp 98.8°F | Resp 16 | Ht 62.0 in | Wt 154.0 lb

## 2018-06-04 DIAGNOSIS — J069 Acute upper respiratory infection, unspecified: Secondary | ICD-10-CM

## 2018-06-04 MED ORDER — BENZONATATE 100 MG PO CAPS: ORAL_CAPSULE | ORAL | 0 refills | Status: DC

## 2018-06-04 NOTE — Patient Instructions (Addendum)
Hot tea with honey and lemon encouraged Lozenges or cough drops as directed Continue Mucinex as directed Make sure you cover your mouth when coughing and proper hand hygiene to reduce the spread of the virus Take meds as directed Encouraged patient to call the office or primary care doctor for an appointment if no improvement in symptoms or if symptoms change or worsen after 72 hours of planned treatment. Patient verbalized understanding of all instructions given/reviewed and has no further questions or concerns at this time.

## 2018-06-04 NOTE — Progress Notes (Signed)
   Subjective:    Patient ID: Dana Jarvis, female    DOB: Aug 18, 1965, 53 y.o.   MRN: [id number]  HPI Dana Jarvis is here for c/o cough and congestion with bilateraly ear fullness x 1 week. She reports temp of 100.6 3 days ago that resolved; which she took ibuprofen for. She reports some SOB and wheezing which is more in morning and evening with increased drainage. She denies sore throat, chest pain. She has been taking Mucinex cough and congestion max with relief.    Review of Systems  Constitutional: Positive for fever. Negative for fatigue.  HENT: Negative for sinus pressure, sinus pain and sore throat.   Respiratory: Positive for cough, shortness of breath and wheezing.   Cardiovascular: Negative for chest pain.       Objective:   Physical Exam Vitals signs reviewed.  Constitutional:      General: She is not in acute distress.    Appearance: Normal appearance. She is well-developed. She is not ill-appearing.  HENT:     Head: Normocephalic and atraumatic.     Comments: no maxillary or frontal sinus tenderness    Right Ear: Ear canal normal.     Left Ear: Ear canal normal.     Ears:     Comments: Bilateral ears bulging with serous fluid and no erythema to intact TM    Nose: Nose normal.     Mouth/Throat:     Mouth: Mucous membranes are moist.     Comments: Mildly injected pharynx Neck:     Musculoskeletal: Normal range of motion and neck supple.     Comments: No cervical lymphadenopathy Cardiovascular:     Rate and Rhythm: Normal rate and regular rhythm.     Heart sounds: Normal heart sounds.  Pulmonary:     Effort: Pulmonary effort is normal. No respiratory distress.     Breath sounds: Normal breath sounds. No wheezing or rhonchi.  Abdominal:     Palpations: There is no mass.  Musculoskeletal: Normal range of motion.  Lymphadenopathy:     Cervical: Cervical adenopathy present.  Skin:    General: Skin is warm and dry.  Neurological:     Mental Status: She is alert and  oriented to person, place, and time.  Psychiatric:        Mood and Affect: Mood normal.           Assessment & Plan:

## 2018-12-24 ENCOUNTER — Other Ambulatory Visit: Payer: Self-pay | Admitting: Physician Assistant

## 2018-12-24 DIAGNOSIS — F4329 Adjustment disorder with other symptoms: Secondary | ICD-10-CM

## 2019-01-15 NOTE — Progress Notes (Signed)
Patient: Dana Jarvis, Female    DOB: April 12, 1965, 53 y.o.   MRN: [id number] Visit Date: 01/18/2019  Today's Provider: Mar Daring, PA-C   Chief Complaint  Patient presents with   Annual Exam   Subjective:     Annual physical exam Dana Jarvis is a 53 y.o. female who presents today for health maintenance and complete physical. She feels well. She reports exercising. She reports she is sleeping well.  ----------------------------------------------------------------- 01/21/2018 -Normal Pap smear, but HPV positive. This is not high risk HPV. Recommendation is to repeat Pap smear in 1 year Mammogram: 03/27/18 BI-RADS CATEGORY  1: Negative Colonoscopy:12/31/16-Repeat colonoscopy in 10 years for screening unless any change in family history or lower GI problems.  Review of Systems  Constitutional: Negative.   HENT: Negative.   Eyes: Negative.   Respiratory: Negative.   Cardiovascular: Negative.   Gastrointestinal: Negative.   Endocrine: Negative.   Genitourinary: Negative.   Musculoskeletal: Negative.   Skin: Negative.   Allergic/Immunologic: Positive for environmental allergies.  Neurological: Negative.   Hematological: Negative.   Psychiatric/Behavioral: Negative.     Social History      She  reports that she has never smoked. She has never used smokeless tobacco. She reports current alcohol use of about 4.0 standard drinks of alcohol per week. She reports that she does not use drugs.       Social History   Socioeconomic History   Marital status: Widowed    Spouse name: Not on file   Number of children: Not on file   Years of education: Not on file   Highest education level: Not on file  Occupational History   Not on file  Social Needs   Financial resource strain: Not on file   Food insecurity    Worry: Not on file    Inability: Not on file   Transportation needs    Medical: Not on file    Non-medical: Not on file  Tobacco Use    Smoking status: Never Smoker   Smokeless tobacco: Never Used  Substance and Sexual Activity   Alcohol use: Yes    Alcohol/week: 4.0 standard drinks    Types: 4 Glasses of wine per week    Comment: occasional   Drug use: No   Sexual activity: Not on file  Lifestyle   Physical activity    Days per week: Not on file    Minutes per session: Not on file   Stress: Not on file  Relationships   Social connections    Talks on phone: Not on file    Gets together: Not on file    Attends religious service: Not on file    Active member of club or organization: Not on file    Attends meetings of clubs or organizations: Not on file    Relationship status: Not on file  Other Topics Concern   Not on file  Social History Narrative   Not on file    Past Medical History:  Diagnosis Date   Arthritis    hands, feet   Complication of anesthesia    has awakened during procedures   GERD (gastroesophageal reflux disease)    Headache    sinus   Motion sickness    ocean ships   Seasonal allergies    Wears contact lenses      Patient Active Problem List   Diagnosis Date Noted   Screening for colon cancer    Arthritis  04/05/2015   Acid reflux 04/05/2015   Blood in the urine 04/05/2015   Cannot sleep 04/05/2015   Adiposity 04/05/2015   Shortened PR interval 04/05/2015   Hyperthyroidism, subclinical 01/17/2015   Snores 01/17/2015   Blood pressure elevated without history of HTN 09/26/2009   Adaptation reaction 10/28/2007   Alcohol drinker 10/28/2007   Allergic rhinitis 10/28/2007    Past Surgical History:  Procedure Laterality Date   ABDOMINAL HYSTERECTOMY  2005   with bilateral salpingo-oophorectomy   CESAREAN SECTION  1988   COLONOSCOPY WITH PROPOFOL N/A 12/31/2016   Procedure: COLONOSCOPY WITH PROPOFOL;  Surgeon: [name], MD;  Location: [facility] ENDOSCOPY;  Service: Endoscopy;  Laterality: N/A;   FRACTURE SURGERY Right 2007   ankle    TONSILLECTOMY     WRIST SURGERY Left    ganglion    Family History        Family Status  Relation Name Status   Mother  Deceased at age 5   Father  Deceased at age 72   Brother  Alive   Sister  Ecologist  (Not Specified)   Cousin  (Not Specified)   Cousin  (Not Specified)        Her family history includes Arthritis in her mother; Breast cancer in her cousin, cousin, and maternal aunt; Breast cancer (age of onset: 51) in her mother; Cancer in her father and mother; Glaucoma in her mother; Healthy in her sister; Hypertension in her brother.      Allergies  Allergen Reactions   Latex Rash   Sulfa Antibiotics Rash     Current Outpatient Medications:    Cetirizine HCl (ZYRTEC ALLERGY) 10 MG CAPS, Take by mouth., Disp: , Rfl:    Cholecalciferol (D3 DOTS) 2000 UNITS TBDP, Take by mouth., Disp: , Rfl:    Cobalamine Combinations (B-12) 6011861099 MCG SUBL, B12, Disp: , Rfl:    escitalopram (LEXAPRO) 10 MG tablet, TAKE ONE TABLET EVERY DAY, Disp: 90 tablet, Rfl: 1   fluticasone (FLONASE) 50 MCG/ACT nasal spray, Place 2 sprays into both nostrils daily., Disp: 16 g, Rfl: 6   ibuprofen (ADVIL,MOTRIN) 200 MG tablet, Take 400 mg by mouth every 6 (six) hours as needed., Disp: , Rfl:    Patient Care Team: Rubye Beach as PCP - General (Family Medicine)    Objective:    Vitals: BP 138/83 (BP Location: Left Arm, Patient Position: Sitting, Cuff Size: Large)    Pulse 65    Temp (!) 97.1 F (36.2 C) (Other (Comment))    Resp 16    Ht 5\' 2"  (1.575 m)    Wt 160 lb 6.4 oz (72.8 kg)    LMP  (LMP Unknown)    BMI 29.34 kg/m    Vitals:   01/18/19 0911  BP: 138/83  Pulse: 65  Resp: 16  Temp: (!) 97.1 F (36.2 C)  TempSrc: Other (Comment)  Weight: 160 lb 6.4 oz (72.8 kg)  Height: 5\' 2"  (1.575 m)     Physical Exam Vitals signs reviewed.  Constitutional:      General: She is not in acute distress.    Appearance: Normal appearance. She is well-developed  and normal weight. She is not ill-appearing or diaphoretic.  HENT:     Head: Normocephalic and atraumatic.     Right Ear: Hearing, tympanic membrane, ear canal and external ear normal.     Left Ear: Hearing, tympanic membrane, ear canal and external ear normal.     Nose:  Nose normal.     Mouth/Throat:     Mouth: Mucous membranes are moist.     Pharynx: Oropharynx is clear. Uvula midline. No oropharyngeal exudate.  Eyes:     General: No scleral icterus.       Right eye: No discharge.        Left eye: No discharge.     Extraocular Movements: Extraocular movements intact.     Conjunctiva/sclera: Conjunctivae normal.     Pupils: Pupils are equal, round, and reactive to light.  Neck:     Musculoskeletal: Normal range of motion and neck supple.     Thyroid: No thyromegaly.     Vascular: No carotid bruit or JVD.     Trachea: No tracheal deviation.  Cardiovascular:     Rate and Rhythm: Normal rate and regular rhythm.     Pulses: Normal pulses.     Heart sounds: Normal heart sounds. No murmur. No friction rub. No gallop.   Pulmonary:     Effort: Pulmonary effort is normal. No respiratory distress.     Breath sounds: Normal breath sounds. No wheezing or rales.  Chest:     Chest wall: No tenderness.     Breasts: Breasts are symmetrical.        Right: No inverted nipple, mass, nipple discharge, skin change or tenderness.        Left: No inverted nipple, mass, nipple discharge, skin change or tenderness.  Abdominal:     General: Abdomen is flat. Bowel sounds are normal. There is no distension.     Palpations: Abdomen is soft. There is no mass.     Tenderness: There is no abdominal tenderness. There is no guarding or rebound.     Hernia: There is no hernia in the left inguinal area.  Genitourinary:    General: Normal vulva.     Exam position: Supine.     Labia:        Right: No rash, tenderness, lesion or injury.        Left: No rash, tenderness, lesion or injury.      Vagina: Normal.  No signs of injury. No vaginal discharge, erythema, tenderness or bleeding.     Cervix: No discharge.     Uterus: Absent.      Adnexa:        Right: No mass, tenderness or fullness.         Left: No mass, tenderness or fullness.       Rectum: Normal.  Musculoskeletal: Normal range of motion.        General: No tenderness.     Right lower leg: No edema.     Left lower leg: No edema.  Lymphadenopathy:     Cervical: No cervical adenopathy.  Skin:    General: Skin is warm and dry.     Capillary Refill: Capillary refill takes less than 2 seconds.     Findings: No rash.  Neurological:     General: No focal deficit present.     Mental Status: She is alert and oriented to person, place, and time. Mental status is at baseline.     Cranial Nerves: No cranial nerve deficit.     Coordination: Coordination normal.     Deep Tendon Reflexes: Reflexes are normal and symmetric.  Psychiatric:        Mood and Affect: Mood normal.        Behavior: Behavior normal.        Thought Content: Thought content normal.  Judgment: Judgment normal.      Depression Screen PHQ 2/9 Scores 01/18/2019 01/15/2018 01/15/2018 11/29/2016  PHQ - 2 Score 0 0 0 0       Assessment & Plan:     Routine Health Maintenance and Physical Exam  Exercise Activities and Dietary recommendations Goals     Exercise 150 minutes per week (moderate activity)       Immunization History  Administered Date(s) Administered   Influenza,inj,Quad PF,6+ Mos 12/21/2013   Influenza-Unspecified 12/23/2016, 01/09/2018   Td 04/01/1988    Health Maintenance  Topic Date Due   HIV Screening  03/15/1979   INFLUENZA VACCINE  10/31/2018   TETANUS/TDAP  06/30/2019 (Originally 04/01/1998)   MAMMOGRAM  03/12/2020   PAP SMEAR-Modifier  01/15/2021   COLONOSCOPY  01/01/2027   Hepatitis C Screening  Completed     Discussed health benefits of physical activity, and encouraged her to engage in regular exercise  appropriate for her age and condition.    1. Annual physical exam Normal physical exam today. Will check labs as below and f/u pending lab results. If labs are stable and WNL she will not need to have these rechecked for one year at her next annual physical exam. She is to call the office in the meantime if she has any acute issue, questions or concerns. - CBC with Differential/Platelet - Comprehensive metabolic panel - Hemoglobin A1c  2. Papanicolaou smear of cervix with positive high risk human papilloma virus (HPV) test Was HPV positive last year without changes. Pap collected today. Will send as below and f/u pending results. - Cytology - PAP  3. Encounter for screening mammogram for malignant neoplasm of breast Breast exam today was normal. There is no family history of breast cancer. She does perform regular self breast exams. Mammogram was ordered as below. Information for [facility] Breast clinic was given to patient so she may schedule her mammogram at her convenience. - MM 3D SCREEN BREAST BILATERAL; Future  4. Hyperthyroidism, subclinical Asymptomatic. Will check labs as below and f/u pending results. - TSH  5. Encounter for lipid screening for cardiovascular disease Will check labs as below and f/u pending results. - Lipid panel  6. Screening for diabetes mellitus Will check labs as below and f/u pending results. - Hemoglobin A1c  7. Screening for HIV without presence of risk factors Will check labs as below and f/u pending results. - HIV antibody (with reflex)  8. Need for influenza vaccination Flu vaccine given today without complication. Patient sat upright for 15 minutes to check for adverse reaction before being released. - Flu Vaccine QUAD 36+ mos IM  --------------------------------------------------------------------    Mar Daring, PA-C  [facility]

## 2019-01-18 ENCOUNTER — Other Ambulatory Visit: Payer: Self-pay

## 2019-01-18 ENCOUNTER — Other Ambulatory Visit (HOSPITAL_COMMUNITY)
Admission: RE | Admit: 2019-01-18 | Discharge: 2019-01-18 | Disposition: A | Discharge status: Home / Self Care | Payer: BC Managed Care – PPO | Source: Ambulatory Visit | Attending: Physician Assistant | Admitting: Physician Assistant

## 2019-01-18 ENCOUNTER — Ambulatory Visit (INDEPENDENT_AMBULATORY_CARE_PROVIDER_SITE_OTHER): Payer: BC Managed Care – PPO | Admitting: Physician Assistant

## 2019-01-18 ENCOUNTER — Encounter: Payer: Self-pay | Admitting: Physician Assistant

## 2019-01-18 VITALS — BP 138/83 | HR 65 | Temp 97.1°F | Resp 16 | Ht 62.0 in | Wt 160.4 lb

## 2019-01-18 DIAGNOSIS — Z131 Encounter for screening for diabetes mellitus: Secondary | ICD-10-CM

## 2019-01-18 DIAGNOSIS — Z23 Encounter for immunization: Secondary | ICD-10-CM

## 2019-01-18 DIAGNOSIS — Z136 Encounter for screening for cardiovascular disorders: Secondary | ICD-10-CM | POA: Diagnosis not present

## 2019-01-18 DIAGNOSIS — R87810 Cervical high risk human papillomavirus (HPV) DNA test positive: Secondary | ICD-10-CM

## 2019-01-18 DIAGNOSIS — Z1231 Encounter for screening mammogram for malignant neoplasm of breast: Secondary | ICD-10-CM

## 2019-01-18 DIAGNOSIS — E0590 Thyrotoxicosis, unspecified without thyrotoxic crisis or storm: Secondary | ICD-10-CM

## 2019-01-18 DIAGNOSIS — Z13220 Encounter for screening for lipoid disorders: Secondary | ICD-10-CM | POA: Diagnosis not present

## 2019-01-18 DIAGNOSIS — Z114 Encounter for screening for human immunodeficiency virus [HIV]: Secondary | ICD-10-CM

## 2019-01-18 DIAGNOSIS — Z0000 Encounter for general adult medical examination without abnormal findings: Secondary | ICD-10-CM

## 2019-01-18 NOTE — Patient Instructions (Signed)

## 2019-01-19 ENCOUNTER — Encounter: Payer: Self-pay | Admitting: Physician Assistant

## 2019-01-19 LAB — COMPREHENSIVE METABOLIC PANEL
ALT: 16 IU/L (ref 0–32)
AST: 25 IU/L (ref 0–40)
Albumin/Globulin Ratio: 1.7 (ref 1.2–2.2)
Albumin: 4.4 g/dL (ref 3.8–4.9)
Alkaline Phosphatase: 76 IU/L (ref 39–117)
BUN/Creatinine Ratio: 16 (ref 9–23)
BUN: 12 mg/dL (ref 6–24)
Bilirubin Total: 0.6 mg/dL (ref 0.0–1.2)
CO2: 23 mmol/L (ref 20–29)
Calcium: 9.7 mg/dL (ref 8.7–10.2)
Chloride: 99 mmol/L (ref 96–106)
Creatinine, Ser: 0.75 mg/dL (ref 0.57–1.00)
GFR calc Af Amer: 105 mL/min/{1.73_m2} (ref >59)
GFR calc non Af Amer: 91 mL/min/{1.73_m2} (ref >59)
Globulin, Total: 2.6 g/dL (ref 1.5–4.5)
Glucose: 110 mg/dL — ABNORMAL HIGH (ref 65–99)
Potassium: 3.6 mmol/L (ref 3.5–5.2)
Sodium: 137 mmol/L (ref 134–144)
Total Protein: 7 g/dL (ref 6.0–8.5)

## 2019-01-19 LAB — CBC WITH DIFFERENTIAL/PLATELET
Basophils Absolute: 0.1 10*3/uL (ref 0.0–0.2)
Basos: 1 %
EOS (ABSOLUTE): 0.1 10*3/uL (ref 0.0–0.4)
Eos: 1 %
Hematocrit: 39.3 % (ref 34.0–46.6)
Hemoglobin: 13.6 g/dL (ref 11.1–15.9)
Immature Grans (Abs): 0 10*3/uL (ref 0.0–0.1)
Immature Granulocytes: 0 %
Lymphocytes Absolute: 2.1 10*3/uL (ref 0.7–3.1)
Lymphs: 24 %
MCH: 33.7 pg — ABNORMAL HIGH (ref 26.6–33.0)
MCHC: 34.6 g/dL (ref 31.5–35.7)
MCV: 98 fL — ABNORMAL HIGH (ref 79–97)
Monocytes Absolute: 0.6 10*3/uL (ref 0.1–0.9)
Monocytes: 6 %
Neutrophils Absolute: 6.1 10*3/uL (ref 1.4–7.0)
Neutrophils: 68 %
Platelets: 275 10*3/uL (ref 150–450)
RBC: 4.03 x10E6/uL (ref 3.77–5.28)
RDW: 10.9 % — ABNORMAL LOW (ref 11.7–15.4)
WBC: 9 10*3/uL (ref 3.4–10.8)

## 2019-01-19 LAB — LIPID PANEL
Chol/HDL Ratio: 1.8 ratio (ref 0.0–4.4)
Cholesterol, Total: 213 mg/dL — ABNORMAL HIGH (ref 100–199)
HDL: 116 mg/dL (ref >39)
LDL Chol Calc (NIH): 85 mg/dL (ref 0–99)
Triglycerides: 69 mg/dL (ref 0–149)
VLDL Cholesterol Cal: 12 mg/dL (ref 5–40)

## 2019-01-19 LAB — TSH: TSH: 2.22 u[IU]/mL (ref 0.450–4.500)

## 2019-01-19 LAB — HEMOGLOBIN A1C
Est. average glucose Bld gHb Est-mCnc: 103 mg/dL
Hgb A1c MFr Bld: 5.2 % (ref 4.8–5.6)

## 2019-01-19 LAB — HIV ANTIBODY (ROUTINE TESTING W REFLEX): HIV Screen 4th Generation wRfx: NONREACTIVE

## 2019-01-21 ENCOUNTER — Telehealth: Payer: Self-pay

## 2019-01-21 LAB — CYTOLOGY - PAP
Comment: NEGATIVE
Diagnosis: NEGATIVE
High risk HPV: NEGATIVE

## 2019-01-21 NOTE — Telephone Encounter (Signed)
  Viewed by Blain Pais on 01/21/2019 1:37 PM Written by Mar Daring, PA-C on 01/21/2019 1:35 PM Pap is normal and negative for HPV this time. Recommend to recheck next year to make sure it stays clear.

## 2019-03-15 ENCOUNTER — Ambulatory Visit
Admission: RE | Admit: 2019-03-15 | Discharge: 2019-03-15 | Disposition: A | Discharge status: Home / Self Care | Payer: BC Managed Care – PPO | Source: Ambulatory Visit | Attending: Physician Assistant | Admitting: Physician Assistant

## 2019-03-15 DIAGNOSIS — Z1231 Encounter for screening mammogram for malignant neoplasm of breast: Secondary | ICD-10-CM | POA: Diagnosis not present

## 2019-03-19 DIAGNOSIS — U071 COVID-19: Secondary | ICD-10-CM | POA: Diagnosis not present

## 2019-03-19 DIAGNOSIS — Z20828 Contact with and (suspected) exposure to other viral communicable diseases: Secondary | ICD-10-CM | POA: Diagnosis not present

## 2019-07-27 ENCOUNTER — Other Ambulatory Visit: Payer: Self-pay | Admitting: Physician Assistant

## 2019-07-27 DIAGNOSIS — J301 Allergic rhinitis due to pollen: Secondary | ICD-10-CM

## 2019-07-27 NOTE — Telephone Encounter (Signed)
Requested medication (s) are due for refill today: yes  Requested medication (s) are on the active medication list: yes  Last refill:  06/05/2018  Future visit scheduled:yes  Notes to clinic: this script is expired  Please review for refill   Requested Prescriptions  Pending Prescriptions Disp Refills   fluticasone (FLONASE) 50 MCG/ACT nasal spray [Pharmacy Med Name: FLUTICASONE PROPIONATE 50 MCG/ACT N] 16 g 6    Sig: SPRAY TWO SPRAYS INTO BOTH NOSTRILS DAILY      Ear, Nose, and Throat: Nasal Preparations - Corticosteroids Failed - 07/27/2019  9:10 AM      Failed - Valid encounter within last 12 months    Recent Outpatient Visits           6 months ago Annual physical exam   [facility] [place], [place] Sorrel, [place]   1 year ago Annual physical exam   [facility] [place], [place], [place]   2 years ago Annual physical exam   [facility] [place], [place]   4 years ago Annual physical exam   [facility] Margarita Rana, [place]

## 2019-08-14 DIAGNOSIS — S0502XA Injury of conjunctiva and corneal abrasion without foreign body, left eye, initial encounter: Secondary | ICD-10-CM | POA: Diagnosis not present

## 2019-08-14 DIAGNOSIS — H18822 Corneal disorder due to contact lens, left eye: Secondary | ICD-10-CM | POA: Diagnosis not present

## 2019-08-25 ENCOUNTER — Ambulatory Visit: Payer: BLUE CROSS/BLUE SHIELD | Admitting: Medical

## 2019-08-25 ENCOUNTER — Encounter: Payer: Self-pay | Admitting: Medical

## 2019-08-25 ENCOUNTER — Other Ambulatory Visit: Payer: Self-pay

## 2019-08-25 VITALS — BP 160/80 | HR 60 | Temp 97.5°F | Resp 16 | Wt 143.2 lb

## 2019-08-25 DIAGNOSIS — L247 Irritant contact dermatitis due to plants, except food: Secondary | ICD-10-CM

## 2019-08-25 MED ORDER — PREDNISONE 10 MG PO TABS: ORAL_TABLET | ORAL | 0 refills | Status: DC

## 2019-08-25 NOTE — Patient Instructions (Signed)
Contact Dermatitis °Dermatitis is redness, soreness, and swelling (inflammation) of the skin. Contact dermatitis is a reaction to something that touches the skin. °There are two types of contact dermatitis: °· Irritant contact dermatitis. This happens when something bothers (irritates) your skin, like soap. °· Allergic contact dermatitis. This is caused when you are exposed to something that you are allergic to, such as poison ivy. °What are the causes? °· Common causes of irritant contact dermatitis include: °? Makeup. °? Soaps. °? Detergents. °? Bleaches. °? Acids. °? Metals, such as nickel. °· Common causes of allergic contact dermatitis include: °? Plants. °? Chemicals. °? Jewelry. °? Latex. °? Medicines. °? Preservatives in products, such as clothing. °What increases the risk? °· Having a job that exposes you to things that bother your skin. °· Having asthma or eczema. °What are the signs or symptoms? °Symptoms may happen anywhere the irritant has touched your skin. Symptoms include: °· Dry or flaky skin. °· Redness. °· Cracks. °· Itching. °· Pain or a burning feeling. °· Blisters. °· Blood or clear fluid draining from skin cracks. °With allergic contact dermatitis, swelling may occur. This may happen in places such as the eyelids, mouth, or genitals. °How is this treated? °· This condition is treated by checking for the cause of the reaction and protecting your skin. Treatment may also include: °? Steroid creams, ointments, or medicines. °? Antibiotic medicines or other ointments, if you have a skin infection. °? Lotion or medicines to help with itching. °? A bandage (dressing). °Follow these instructions at home: °Skin care °· Moisturize your skin as needed. °· Put cool cloths on your skin. °· Put a baking soda paste on your skin. Stir water into baking soda until it looks like a paste. °· Do not scratch your skin. °· Avoid having things rub up against your skin. °· Avoid the use of soaps, perfumes, and  dyes. °Medicines °· Take or apply over-the-counter and prescription medicines only as told by your doctor. °· If you were prescribed an antibiotic medicine, take or apply it as told by your doctor. Do not stop using it even if your condition starts to get better. °Bathing °· Take a bath with: °? Epsom salts. °? Baking soda. °? Colloidal oatmeal. °· Bathe less often. °· Bathe in warm water. Avoid using hot water. °Bandage care °· If you were given a bandage, change it as told by your health care provider. °· Wash your hands with soap and water before and after you change your bandage. If soap and water are not available, use hand sanitizer. °General instructions °· Avoid the things that caused your reaction. If you do not know what caused it, keep a journal. Write down: °? What you eat. °? What skin products you use. °? What you drink. °? What you wear in the area that has symptoms. This includes jewelry. °· Check the affected areas every day for signs of infection. Check for: °? More redness, swelling, or pain. °? More fluid or blood. °? Warmth. °? Pus or a bad smell. °· Keep all follow-up visits as told by your doctor. This is important. °Contact a doctor if: °· You do not get better with treatment. °· Your condition gets worse. °· You have signs of infection, such as: °? More swelling. °? Tenderness. °? More redness. °? Soreness. °? Warmth. °· You have a fever. °· You have new symptoms. °Get help right away if: °· You have a very bad headache. °· You have neck pain. °·   Your neck is stiff. °· You throw up (vomit). °· You feel very sleepy. °· You see red streaks coming from the area. °· Your bone or joint near the area hurts after the skin has healed. °· The area turns darker. °· You have trouble breathing. °Summary °· Dermatitis is redness, soreness, and swelling of the skin. °· Symptoms may occur where the irritant has touched you. °· Treatment may include medicines and skin care. °· If you do not know what caused  your reaction, keep a journal. °· Contact a doctor if your condition gets worse or you have signs of infection. °This information is not intended to replace advice given to you by your health care provider. Make sure you discuss any questions you have with your health care provider. °Document Revised: 07/08/2018 Document Reviewed: 10/01/2017 °Elsevier Patient Education © 2020 [employer] ° °

## 2019-08-25 NOTE — Progress Notes (Signed)
   Subjective:    Patient ID: Dana Jarvis, female    DOB: 27-Feb-1966, 54 y.o.   MRN: [id number]  HPI 54 yo female in non acute distress. Worked in the yard last Saturday, rash started on  Sunday on Left arm.   " Seems like it is spreading". It is itchy and irritating.Patient has previously had poison ivy and she states this is the same. She is using [employer] cream which seems to help. She is taking Zyrtec . Blood pressure (!) 160/80, pulse 60, temperature (!) 97.5 F (36.4 C), temperature source Temporal, resp. rate 16, weight 143 lb 3.2 oz (65 kg), SpO2 98 %.  Patient mentions her blood pressure is normal not elevated, possibly due to itching and irritation of skin.  Allergies  Allergen Reactions  . Latex Rash  . Sulfa Antibiotics Rash       Review of Systems  Constitutional: Negative for chills and fever.  Skin: Positive for rash.  See HPI/ Patient tan.    Objective:   Physical Exam Vitals and nursing note reviewed.  Constitutional:      Appearance: Normal appearance.  HENT:     Head: Normocephalic and atraumatic.  Eyes:     Extraocular Movements: Extraocular movements intact.     Conjunctiva/sclera: Conjunctivae normal.     Pupils: Pupils are equal, round, and reactive to light.  Pulmonary:     Effort: Pulmonary effort is normal.  Skin:    General: Skin is warm and dry.     Capillary Refill: Capillary refill takes less than 2 seconds.     Findings: Erythema and rash present.     Comments: No sign of infection  Neurological:     General: No focal deficit present.     Mental Status: She is alert and oriented to person, place, and time. Mental status is at baseline.  Psychiatric:        Mood and Affect: Mood normal.        Behavior: Behavior normal.        Thought Content: Thought content normal.        Judgment: Judgment normal.    Maculopapular, vessicular rash located on left anterior forearm, with erythema. Uticarial.        Assessment & Plan:   Contact Dermatitis by non food plant. Meds ordered this encounter  Medications  . predniSONE (DELTASONE) 10 MG tablet    Sig: Take 6 tablets by mouth today, then 5 tablets tomorrow , then take one less tablet everyday thereafter.    Dispense:  21 tablet    Refill:  0   If rash continues after 7 days patient will call me and I will call in a second Prednisone prescription.Patient verbalizes understanding and has no questions at discharge.

## 2020-01-24 ENCOUNTER — Encounter: Payer: Self-pay | Admitting: Physician Assistant

## 2020-01-31 ENCOUNTER — Encounter: Payer: Self-pay | Admitting: Physician Assistant

## 2020-01-31 ENCOUNTER — Ambulatory Visit (INDEPENDENT_AMBULATORY_CARE_PROVIDER_SITE_OTHER): Payer: BC Managed Care – PPO | Admitting: Physician Assistant

## 2020-01-31 ENCOUNTER — Other Ambulatory Visit: Payer: Self-pay

## 2020-01-31 VITALS — BP 127/83 | HR 64 | Temp 98.9°F | Wt 155.0 lb

## 2020-01-31 DIAGNOSIS — Z1231 Encounter for screening mammogram for malignant neoplasm of breast: Secondary | ICD-10-CM | POA: Diagnosis not present

## 2020-01-31 DIAGNOSIS — K649 Unspecified hemorrhoids: Secondary | ICD-10-CM

## 2020-01-31 DIAGNOSIS — E0590 Thyrotoxicosis, unspecified without thyrotoxic crisis or storm: Secondary | ICD-10-CM | POA: Diagnosis not present

## 2020-01-31 DIAGNOSIS — Z23 Encounter for immunization: Secondary | ICD-10-CM

## 2020-01-31 DIAGNOSIS — R87810 Cervical high risk human papillomavirus (HPV) DNA test positive: Secondary | ICD-10-CM | POA: Diagnosis not present

## 2020-01-31 DIAGNOSIS — Z136 Encounter for screening for cardiovascular disorders: Secondary | ICD-10-CM

## 2020-01-31 DIAGNOSIS — Z131 Encounter for screening for diabetes mellitus: Secondary | ICD-10-CM

## 2020-01-31 DIAGNOSIS — Z0000 Encounter for general adult medical examination without abnormal findings: Secondary | ICD-10-CM | POA: Diagnosis not present

## 2020-01-31 DIAGNOSIS — Z13220 Encounter for screening for lipoid disorders: Secondary | ICD-10-CM

## 2020-01-31 NOTE — Progress Notes (Signed)
Complete physical exam   Patient: Dana Jarvis   DOB: [birth year]   54 y.o. Female  MRN: [id number] Visit Date: 01/31/2020  Today's healthcare provider: Mar Daring, PA-C   Chief Complaint  Patient presents with  . Annual Exam   Subjective    Dana Jarvis is a 54 y.o. female who presents today for a complete physical exam.  She reports consuming a general diet. Gym/ health club routine includes cardio and light weights. She generally feels well. She reports sleeping well. She does have additional problems to discuss today.  HPI   Hemorrhoids: Patient complains of evaluation of possible hemorrhoids. Onset of symptoms was abrupt starting 1 month ago ago with unchanged course since that time.  She describes symptoms as bleeding which only occurs with bowel movements and painful defecation. Treatment to date has been none.    Past Medical History:  Diagnosis Date  . Arthritis    hands, feet  . Complication of anesthesia    has awakened during procedures  . GERD (gastroesophageal reflux disease)   . Headache    sinus  . Motion sickness    ocean ships  . Seasonal allergies   . Wears contact lenses    Past Surgical History:  Procedure Laterality Date  . ABDOMINAL HYSTERECTOMY  2005   with bilateral salpingo-oophorectomy  . CESAREAN SECTION  1988  . COLONOSCOPY WITH PROPOFOL N/A 12/31/2016   Procedure: COLONOSCOPY WITH PROPOFOL;  Surgeon: [name], MD;  Location: [facility] ENDOSCOPY;  Service: Endoscopy;  Laterality: N/A;  . FRACTURE SURGERY Right 2007   ankle  . TONSILLECTOMY    . WRIST SURGERY Left    ganglion   Social History   Socioeconomic History  . Marital status: Widowed    Spouse name: Not on file  . Number of children: Not on file  . Years of education: Not on file  . Highest education level: Not on file  Occupational History  . Not on file  Tobacco Use  . Smoking status: Never Smoker  . Smokeless tobacco: Never Used  Substance and Sexual  Activity  . Alcohol use: Yes    Alcohol/week: 4.0 standard drinks    Types: 4 Glasses of wine per week    Comment: occasional  . Drug use: No  . Sexual activity: Not on file  Other Topics Concern  . Not on file  Social History Narrative  . Not on file   Social Determinants of Health   Financial Resource Strain:   . Difficulty of Paying Living Expenses: Not on file  Food Insecurity:   . Worried About Charity fundraiser in the Last Year: Not on file  . Ran Out of Food in the Last Year: Not on file  Transportation Needs:   . Lack of Transportation (Medical): Not on file  . Lack of Transportation (Non-Medical): Not on file  Physical Activity:   . Days of Exercise per Week: Not on file  . Minutes of Exercise per Session: Not on file  Stress:   . Feeling of Stress : Not on file  Social Connections:   . Frequency of Communication with Friends and Family: Not on file  . Frequency of Social Gatherings with Friends and Family: Not on file  . Attends Religious Services: Not on file  . Active Member of Clubs or Organizations: Not on file  . Attends Archivist Meetings: Not on file  . Marital Status: Not on file  Intimate  Partner Violence:   . Fear of Current or Ex-Partner: Not on file  . Emotionally Abused: Not on file  . Physically Abused: Not on file  . Sexually Abused: Not on file   Family Status  Relation Name Status  . Mother  Deceased at age 32  . Father  Deceased at age 33  . Brother  Alive  . Sister  Alive  . Mat Aunt  (Not Specified)  . Cousin  (Not Specified)  . Cousin  (Not Specified)  . Neg Hx  (Not Specified)   Family History  Problem Relation Age of Onset  . Arthritis Mother   . Cancer Mother        breast  . Glaucoma Mother   . Breast cancer Mother 28  . Cancer Father        lung  . Hypertension Brother   . Hypertension Sister   . Breast cancer Maternal Aunt   . Breast cancer Cousin   . Breast cancer Cousin   . Colon cancer Neg Hx   .  Ovarian cancer Neg Hx    Allergies  Allergen Reactions  . Latex Rash  . Sulfa Antibiotics Rash    Patient Care Team: Rubye Beach as PCP - General (Family Medicine)   Medications: Outpatient Medications Prior to Visit  Medication Sig  . Cetirizine HCl (ZYRTEC ALLERGY) 10 MG CAPS Take by mouth.  . Cholecalciferol (D3 DOTS) 2000 UNITS TBDP Take by mouth.  . Cobalamine Combinations (B-12) [phone] MCG SUBL B12  . escitalopram (LEXAPRO) 10 MG tablet TAKE ONE TABLET EVERY DAY (Patient taking differently: Take 5 mg by mouth daily. )  . fluticasone (FLONASE) 50 MCG/ACT nasal spray SPRAY TWO SPRAYS INTO BOTH NOSTRILS DAILY  . ibuprofen (ADVIL,MOTRIN) 200 MG tablet Take 400 mg by mouth every 6 (six) hours as needed.  . fexofenadine (ALLEGRA) 180 MG tablet Take 180 mg by mouth daily. Taking in a.m., Zyrtec in p.m. to help with poison ivy itching  . predniSONE (DELTASONE) 10 MG tablet Take 6 tablets by mouth today, then 5 tablets tomorrow , then take one less tablet everyday thereafter.   No facility-administered medications prior to visit.    Review of Systems  Constitutional: Negative.   HENT: Negative.   Eyes: Negative.   Respiratory: Negative.   Cardiovascular: Negative.   Gastrointestinal: Positive for anal bleeding. Negative for abdominal distention, abdominal pain, blood in stool, constipation, diarrhea, nausea, rectal pain and vomiting.  Endocrine: Negative.   Genitourinary: Negative.   Musculoskeletal: Positive for arthralgias. Negative for back pain, gait problem, joint swelling, myalgias, neck pain and neck stiffness.  Skin: Negative.   Allergic/Immunologic: Negative.   Neurological: Negative.   Hematological: Negative.   Psychiatric/Behavioral: Negative.     Last CBC Lab Results  Component Value Date   WBC 9.0 01/18/2019   HGB 13.6 01/18/2019   HCT 39.3 01/18/2019   MCV 98 (H) 01/18/2019   MCH 33.7 (H) 01/18/2019   RDW 10.9 (L) 01/18/2019   PLT 275  50/35/4656   Last metabolic panel Lab Results  Component Value Date   GLUCOSE 110 (H) 01/18/2019   NA 137 01/18/2019   K 3.6 01/18/2019   CL 99 01/18/2019   CO2 23 01/18/2019   BUN 12 01/18/2019   CREATININE 0.75 01/18/2019   GFRNONAA 91 01/18/2019   GFRAA 105 01/18/2019   CALCIUM 9.7 01/18/2019   PROT 7.0 01/18/2019   ALBUMIN 4.4 01/18/2019   LABGLOB 2.6 01/18/2019   AGRATIO  1.7 01/18/2019   BILITOT 0.6 01/18/2019   ALKPHOS 76 01/18/2019   AST 25 01/18/2019   ALT 16 01/18/2019      Objective    BP 127/83 (BP Location: Left Arm, Patient Position: Sitting, Cuff Size: Large)   Pulse 64   Temp 98.9 F (37.2 C) (Oral)   Wt 155 lb (70.3 kg)   LMP  (LMP Unknown)   BMI 28.35 kg/m  BP Readings from Last 3 Encounters:  01/31/20 127/83  08/25/19 (!) 160/80  01/18/19 138/83   Wt Readings from Last 3 Encounters:  01/31/20 155 lb (70.3 kg)  08/25/19 143 lb 3.2 oz (65 kg)  01/18/19 160 lb 6.4 oz (72.8 kg)      Physical Exam Vitals reviewed.  Constitutional:      General: She is not in acute distress.    Appearance: Normal appearance. She is well-developed and normal weight. She is not ill-appearing or diaphoretic.  HENT:     Head: Normocephalic and atraumatic.     Right Ear: Tympanic membrane, ear canal and external ear normal.     Left Ear: Tympanic membrane, ear canal and external ear normal.     Nose: Nose normal.     Mouth/Throat:     Mouth: Mucous membranes are moist.     Pharynx: Oropharynx is clear. No oropharyngeal exudate or posterior oropharyngeal erythema.  Eyes:     General: No scleral icterus.       Right eye: No discharge.        Left eye: No discharge.     Extraocular Movements: Extraocular movements intact.     Conjunctiva/sclera: Conjunctivae normal.     Pupils: Pupils are equal, round, and reactive to light.  Neck:     Thyroid: No thyromegaly.     Vascular: No carotid bruit or JVD.     Trachea: No tracheal deviation.  Cardiovascular:      Rate and Rhythm: Normal rate and regular rhythm.     Pulses: Normal pulses.     Heart sounds: Normal heart sounds. No murmur heard.  No friction rub. No gallop.   Pulmonary:     Effort: Pulmonary effort is normal. No respiratory distress.     Breath sounds: Normal breath sounds. No wheezing or rales.  Chest:     Chest wall: No tenderness.  Abdominal:     General: Abdomen is flat. Bowel sounds are normal. There is no distension.     Palpations: Abdomen is soft. There is no mass.     Tenderness: There is no abdominal tenderness. There is no guarding or rebound.  Musculoskeletal:        General: Normal range of motion.     Cervical back: Normal range of motion and neck supple. No tenderness.     Right lower leg: No edema.     Left lower leg: No edema.  Lymphadenopathy:     Cervical: No cervical adenopathy.  Skin:    General: Skin is warm and dry.     Capillary Refill: Capillary refill takes less than 2 seconds.     Findings: No rash.  Neurological:     General: No focal deficit present.     Mental Status: She is alert and oriented to person, place, and time. Mental status is at baseline.  Psychiatric:        Mood and Affect: Mood normal.        Behavior: Behavior normal.        Thought Content: Thought  content normal.        Judgment: Judgment normal.       Last depression screening scores PHQ 2/9 Scores 01/18/2019 01/15/2018 01/15/2018  PHQ - 2 Score 0 0 0   Last fall risk screening Fall Risk  01/18/2019  Falls in the past year? 0  Number falls in past yr: 0  Injury with Fall? 0   Last Audit-C alcohol use screening Alcohol Use Disorder Test (AUDIT) 01/18/2019  1. How often do you have a drink containing alcohol? 3  2. How many drinks containing alcohol do you have on a typical day when you are drinking? 0  3. How often do you have six or more drinks on one occasion? 0  AUDIT-C Score 3  Alcohol Brief Interventions/Follow-up AUDIT Score <7 follow-up not indicated   A  score of 3 or more in women, and 4 or more in men indicates increased risk for alcohol abuse, EXCEPT if all of the points are from question 1   No results found for any visits on 01/31/20.  Assessment & Plan    Routine Health Maintenance and Physical Exam  Exercise Activities and Dietary recommendations Goals    . Exercise 150 minutes per week (moderate activity)       Immunization History  Administered Date(s) Administered  . Influenza,inj,Quad PF,6+ Mos 12/21/2013, 01/18/2019  . Influenza-Unspecified 12/23/2016, 01/09/2018, 12/15/2019  . Janssen (J&J) SARS-COV-2 Vaccination 06/28/2019  . Td 04/01/1988    Health Maintenance  Topic Date Due  . TETANUS/TDAP  04/01/1998  . MAMMOGRAM  03/14/2021  . PAP SMEAR-Modifier  01/17/2022  . COLONOSCOPY  01/01/2027  . INFLUENZA VACCINE  Completed  . COVID-19 Vaccine  Completed  . Hepatitis C Screening  Completed  . HIV Screening  Completed    Discussed health benefits of physical activity, and encouraged her to engage in regular exercise appropriate for her age and condition.  1. Annual physical exam Normal physical exam today. Will check labs as below and f/u pending lab results. If labs are stable and WNL she will not need to have these rechecked for one year at her next annual physical exam. She is to call the office in the meantime if she has any acute issue, questions or concerns. - CBC with Differential/Platelet - Comprehensive metabolic panel  2. Papanicolaou smear of cervix with positive high risk human papilloma virus (HPV) test H/O this. Most recent pap was normal. Repeat pap in 3 years (2023).  3. Encounter for screening mammogram for malignant neoplasm of breast Breast exam today was normal. There is no family history of breast cancer. She does perform regular self breast exams. Mammogram was ordered as below. Information for [facility] was given to patient so she may schedule her mammogram at her  convenience. - MM 3D SCREEN BREAST BILATERAL; Future  4. Hyperthyroidism, subclinical H/O this. Will check labs as below and f/u pending results. - Comprehensive metabolic panel - TSH  5. Encounter for lipid screening for cardiovascular disease Will check labs as below and f/u pending results. - Lipid panel  6. Screening for diabetes mellitus Will check labs as below and f/u pending results. - Hemoglobin A1c  7. Need for Tdap vaccination Tdap Vaccine given to patient without complications. Patient sat for 15 minutes after administration and was tolerated well without adverse effects. - Tdap vaccine greater than or equal to 7yo IM  8. Hemorrhoids, unspecified hemorrhoid type Does have internal hemorrhoids known from previous colonoscopy. Reports they have been causing  issues for the last few months. Does do weight lifting so straining may be aggravating. Discussed epsom salt soaks, tuks (witch hazel) pads, and Preparation H. Will call if this conservative management does not help.    No follow-ups on file.     Reynolds Bowl, PA-C, have reviewed all documentation for this visit. The documentation on 02/01/20 for the exam, diagnosis, procedures, and orders are all accurate and complete.   Rubye Beach  [facility] [phone] (phone) [phone] (fax)  [place]

## 2020-01-31 NOTE — Patient Instructions (Addendum)
[facility] at [facility] [place],  [place]  [id number] [address]: [phone]   Hemorrhoids Hemorrhoids are swollen veins that may develop:  In the butt (rectum). These are called internal hemorrhoids.  Around the opening of the butt (anus). These are called external hemorrhoids. Hemorrhoids can cause pain, itching, or bleeding. Most of the time, they do not cause serious problems. They usually get better with diet changes, lifestyle changes, and other home treatments. What are the causes? This condition may be caused by:  Having trouble pooping (constipation).  Pushing hard (straining) to poop.  Watery poop (diarrhea).  Pregnancy.  Being very overweight (obese).  Sitting for long periods of time.  Heavy lifting or other activity that causes you to strain.  Anal sex.  Riding a bike for a long period of time. What are the signs or symptoms? Symptoms of this condition include:  Pain.  Itching or soreness in the butt.  Bleeding from the butt.  Leaking poop.  Swelling in the area.  One or more lumps around the opening of your butt. How is this diagnosed? A doctor can often diagnose this condition by looking at the affected area. The doctor may also:  Do an exam that involves feeling the area with a gloved hand (digital rectal exam).  Examine the area inside your butt using a small tube (anoscope).  Order blood tests. This may be done if you have lost a lot of blood.  Have you get a test that involves looking inside the colon using a flexible tube with a camera on the end (sigmoidoscopy or colonoscopy). How is this treated? This condition can usually be treated at home. Your doctor may tell you to change what you eat, make lifestyle changes, or try home treatments. If these do not help, procedures can be done to remove the hemorrhoids or make them smaller. These may involve:  Placing rubber bands at the base of the hemorrhoids  to cut off their blood supply.  Injecting medicine into the hemorrhoids to shrink them.  Shining a type of light energy onto the hemorrhoids to cause them to fall off.  Doing surgery to remove the hemorrhoids or cut off their blood supply. Follow these instructions at home: Eating and drinking   Eat foods that have a lot of fiber in them. These include whole grains, beans, nuts, fruits, and vegetables.  Ask your doctor about taking products that have added fiber (fibersupplements).  Reduce the amount of fat in your diet. You can do this by: ? Eating low-fat dairy products. ? Eating less red meat. ? Avoiding processed foods.  Drink enough fluid to keep your pee (urine) pale yellow. Managing pain and swelling   Take a warm-water bath (sitz bath) for 20 minutes to ease pain. Do this 3-4 times a day. You may do this in a bathtub or using a portable sitz bath that fits over the toilet.  If told, put ice on the painful area. It may be helpful to use ice between your warm baths. ? Put ice in a plastic bag. ? Place a towel between your skin and the bag. ? Leave the ice on for 20 minutes, 2-3 times a day. General instructions  Take over-the-counter and prescription medicines only as told by your doctor. ? Medicated creams and medicines may be used as told.  Exercise often. Ask your doctor how much and what kind of exercise is best for you.  Go to the bathroom when  you have the urge to poop. Do not wait.  Avoid pushing too hard when you poop.  Keep your butt dry and clean. Use wet toilet paper or moist towelettes after pooping.  Do not sit on the toilet for a long time.  Keep all follow-up visits as told by your doctor. This is important. Contact a doctor if you:  Have pain and swelling that do not get better with treatment or medicine.  Have trouble pooping.  Cannot poop.  Have pain or swelling outside the area of the hemorrhoids. Get help right away if you  have:  Bleeding that will not stop. Summary  Hemorrhoids are swollen veins in the butt or around the opening of the butt.  They can cause pain, itching, or bleeding.  Eat foods that have a lot of fiber in them. These include whole grains, beans, nuts, fruits, and vegetables.  Take a warm-water bath (sitz bath) for 20 minutes to ease pain. Do this 3-4 times a day. This information is not intended to replace advice given to you by your health care provider. Make sure you discuss any questions you have with your health care provider. Document Revised: 03/26/2018 Document Reviewed: 08/07/2017 Elsevier Patient Education  [place].

## 2020-02-29 DIAGNOSIS — Z23 Encounter for immunization: Secondary | ICD-10-CM | POA: Diagnosis not present

## 2020-04-26 ENCOUNTER — Ambulatory Visit
Admission: RE | Admit: 2020-04-26 | Discharge: 2020-04-26 | Disposition: A | Discharge status: Home / Self Care | Payer: BC Managed Care – PPO | Source: Ambulatory Visit | Attending: Physician Assistant | Admitting: Physician Assistant

## 2020-04-26 ENCOUNTER — Other Ambulatory Visit: Payer: Self-pay

## 2020-04-26 DIAGNOSIS — Z1231 Encounter for screening mammogram for malignant neoplasm of breast: Secondary | ICD-10-CM

## 2020-04-28 ENCOUNTER — Other Ambulatory Visit: Payer: Self-pay | Admitting: Physician Assistant

## 2020-04-28 DIAGNOSIS — R928 Other abnormal and inconclusive findings on diagnostic imaging of breast: Secondary | ICD-10-CM

## 2020-05-05 ENCOUNTER — Ambulatory Visit
Admission: RE | Admit: 2020-05-05 | Discharge: 2020-05-05 | Disposition: A | Discharge status: Home / Self Care | Payer: BC Managed Care – PPO | Source: Ambulatory Visit | Attending: Physician Assistant | Admitting: Physician Assistant

## 2020-05-05 ENCOUNTER — Other Ambulatory Visit: Payer: Self-pay

## 2020-05-05 DIAGNOSIS — R928 Other abnormal and inconclusive findings on diagnostic imaging of breast: Secondary | ICD-10-CM | POA: Insufficient documentation

## 2020-05-05 DIAGNOSIS — N6489 Other specified disorders of breast: Secondary | ICD-10-CM | POA: Diagnosis not present

## 2020-05-09 ENCOUNTER — Encounter: Payer: Self-pay | Admitting: Physician Assistant

## 2020-05-11 ENCOUNTER — Other Ambulatory Visit: Payer: Self-pay | Admitting: Physician Assistant

## 2020-05-11 DIAGNOSIS — R928 Other abnormal and inconclusive findings on diagnostic imaging of breast: Secondary | ICD-10-CM

## 2020-05-19 ENCOUNTER — Other Ambulatory Visit: Payer: Self-pay

## 2020-05-19 ENCOUNTER — Ambulatory Visit
Admission: RE | Admit: 2020-05-19 | Discharge: 2020-05-19 | Disposition: A | Discharge status: Home / Self Care | Payer: BC Managed Care – PPO | Source: Ambulatory Visit | Attending: Physician Assistant | Admitting: Physician Assistant

## 2020-05-19 DIAGNOSIS — R928 Other abnormal and inconclusive findings on diagnostic imaging of breast: Secondary | ICD-10-CM | POA: Insufficient documentation

## 2020-05-19 HISTORY — PX: BREAST BIOPSY: SHX20

## 2020-05-22 ENCOUNTER — Encounter: Payer: Self-pay | Admitting: *Deleted

## 2020-05-22 LAB — SURGICAL PATHOLOGY

## 2020-05-22 NOTE — Progress Notes (Signed)
Notified by Electa Sniff, RN at [facility] of patients benign pathology, but need for surgical consultation for a high risk lesion.  I have talked with the patient and she would like to see Dr. Peyton Najjar at [facility]  I have scheduled her an appointment for 05/25/20 @2 :15.  She is to calll with any questions or needs.

## 2020-05-25 ENCOUNTER — Ambulatory Visit: Payer: Self-pay | Admitting: General Surgery

## 2020-05-25 DIAGNOSIS — N6489 Other specified disorders of breast: Secondary | ICD-10-CM | POA: Diagnosis not present

## 2020-05-25 NOTE — H&P (Signed)
PATIENT PROFILE: Dana Jarvis is a 55 y.o. female who presents to the Clinic for consultation at the request of Dr. Marlyn Corporal for evaluation of radial scar of the right breast.  PCP:  Mechele Collin, PA  HISTORY OF PRESENT ILLNESS: Ms. Strzelecki reports patient had a screening mammogram.  This showed area of distortion of the right breast.  Diagnostic mammogram confirmed area of the right breast.  Core biopsy was consistent with radial scar.  Patient denies any palpable mass, skin changes, nipple retraction, nipple discharge.  Family history of breast cancer: Mother, aunt, cousin Family history of other cancers: Father with lung cancer, mother with multiple myeloma, uncle with colon cancer Menarche: 25 year old Menopause: Hysterectomy at 55 years old due to fibroids Used OCP: Yes Used estrogen and progesterone therapy: None History of Radiation to the chest: None Number pregnancy: 1 Age of pregnancy: 20  Previous breast biopsy: None   PROBLEM LIST: Radial scar of the breast  GENERAL REVIEW OF SYSTEMS:   General ROS: negative for - chills, fatigue, fever, weight gain or weight loss Allergy and Immunology ROS: negative for - hives  Hematological and Lymphatic ROS: negative for - bleeding problems or bruising, negative for palpable nodes Endocrine ROS: negative for - heat or cold intolerance, hair changes Respiratory ROS: negative for - cough, shortness of breath or wheezing Cardiovascular ROS: no chest pain or palpitations GI ROS: negative for nausea, vomiting, abdominal pain, diarrhea, constipation Musculoskeletal ROS: negative for - joint swelling or muscle pain Neurological ROS: negative for - confusion, syncope Dermatological ROS: negative for pruritus and rash Psychiatric: negative for anxiety, depression, difficulty sleeping and memory loss  MEDICATIONS: Current Medications        Current Outpatient Medications  Medication Sig Dispense Refill  . ascorbic  acid, vitamin C, (VITAMIN C) 500 MG tablet Take 500 mg by mouth once daily    . cetirizine (ZYRTEC) 10 MG tablet daily    . cholecalciferol (VITAMIN D3) 2,000 unit capsule once daily    . COLLAGEN MISC Take by mouth once daily    . cyanocobalamin/folic acid (VITAMIN Y86-VHQIO ACID) 9,629-528 mcg Lozg once daily    . escitalopram oxalate (LEXAPRO) 10 MG tablet Take 5 mg by mouth as needed    . fluticasone propionate (FLONASE) 50 mcg/actuation nasal spray as needed    . ibuprofen (MOTRIN) 200 MG tablet Take by mouth as needed    . multivitamin tablet Take 1 tablet by mouth once daily    . zinc 50 mg Tab Take by mouth once daily     No current facility-administered medications for this visit.      ALLERGIES: Other and Sulfa (sulfonamide antibiotics)  PAST MEDICAL HISTORY: History reviewed. No pertinent past medical history.  PAST SURGICAL HISTORY:      Past Surgical History:  Procedure Laterality Date  . ankle surgery  2007   metal taken out in 2020  . EXCISION GANGLION CYST WRIST PRIMARY  2002  . HYSTERECTOMY  10/2004   @ [facility] --- TOTAL - Dr Dahlia Byes  . TONSILLECTOMY     age 40     FAMILY HISTORY:      Family History  Problem Relation Age of Onset  . Breast cancer Mother   . Arthritis Mother   . Heart valve disease Mother   . Lung cancer Father   . Coronary Artery Disease (Blocked arteries around heart) Father   . High blood pressure (Hypertension) Sister   . Arthritis Sister   .  Obesity Sister   . Heart valve disease Sister   . High blood pressure (Hypertension) Brother   . Obesity Brother   . Arthritis Maternal Aunt   . Heart valve disease Maternal Aunt   . Colon cancer Maternal Uncle   . Arthritis Maternal Grandmother   . High blood pressure (Hypertension) Son   . Obesity Son   . Asthma Son      SOCIAL HISTORY: Social History          Socioeconomic History  . Marital status: Unknown    Spouse  name: Not on file  . Number of children: Not on file  . Years of education: Not on file  . Highest education level: Not on file  Occupational History  . Not on file  Tobacco Use  . Smoking status: Never Smoker  . Smokeless tobacco: Never Used  Vaping Use  . Vaping Use: Never used  Substance and Sexual Activity  . Alcohol use: Yes    Alcohol/week: 14.0 standard drinks    Types: 14 Glasses of wine per week  . Drug use: Never  . Sexual activity: Not on file  Other Topics Concern  . Not on file  Social History Narrative  . Not on file   Social Determinants of Health   Financial Resource Strain: Not on file  Food Insecurity: Not on file  Transportation Needs: Not on file      PHYSICAL EXAM:    Vitals:   05/25/20 1415  BP: 134/82  Pulse: 73   Body mass index is 28.79 kg/m. Weight: 71.4 kg (157 lb 6.4 oz)   GENERAL: Alert, active, oriented x3  HEENT: Pupils equal reactive to light. Extraocular movements are intact. Sclera clear. Palpebral conjunctiva normal red color.Pharynx clear.  NECK: Supple with no palpable mass and no adenopathy.  LUNGS: Sound clear with no rales rhonchi or wheezes.  HEART: Regular rhythm S1 and S2 without murmur.  BREAST: left breast normal without mass, skin or nipple changes or axillary nodes, abnormal mass palpable on the right breast at 11:00 due to hematoma.  Moderate sized bruise.  No other abnormality palpated.  No axillary adenopathy..  ABDOMEN: Soft and depressible, nontender with no palpable mass, no hepatomegaly.  EXTREMITIES: Well-developed well-nourished symmetrical with no dependent edema.  NEUROLOGICAL: Awake alert oriented, facial expression symmetrical, moving all extremities.  REVIEW OF DATA: I have reviewed the following data today: No visits with results within 3 Month(s) from this visit.  Latest known visit with results is:  No results found for any previous visit.     ASSESSMENT: Ms. Glaus  is a 55 y.o. female presenting for consultation for right breast radial scar.    Patient was oriented again about the pathology results. Surgical alternatives were discussed with patient including excisional biopsy. Surgical technique and post operative care was discussed with patient. Risk of surgery was discussed with patient including but not limited to: wound infection, seroma, hematoma, brachial plexopathy, mondor's disease (thrombosis of small veins of breast), chronic wound pain, breast lymphedema, altered sensation to the nipple and cosmesis among others.   Radial scar of breast [N64.89]  PLAN: 1. Will proceed with radiofrequency tagged excisional biopsy of the right breast 2. Avoid taking aspirin 5 days before procedure 3. Contact us if you have any concern.   Patient verbalized understanding, all questions were answered, and were agreeable with the plan outlined above.     Herbert Pun, MD  Electronically signed by Herbert Pun, MD

## 2020-05-25 NOTE — H&P (View-Only) (Signed)
PATIENT PROFILE: Dana Jarvis is a 55 y.o. female who presents to the Clinic for consultation at the request of Dr. Marlyn Corporal for evaluation of radial scar of the right breast.  PCP:  Dana Collin, PA  HISTORY OF PRESENT ILLNESS: Dana Jarvis reports patient had a screening mammogram.  This showed area of distortion of the right breast.  Diagnostic mammogram confirmed area of the right breast.  Core biopsy was consistent with radial scar.  Patient denies any palpable mass, skin changes, nipple retraction, nipple discharge.  Family history of breast cancer: Mother, aunt, cousin Family history of other cancers: Father with lung cancer, mother with multiple myeloma, uncle with colon cancer Menarche: 47 year old Menopause: Hysterectomy at 55 years old due to fibroids Used OCP: Yes Used estrogen and progesterone therapy: None History of Radiation to the chest: None Number pregnancy: 1 Age of pregnancy: 20  Previous breast biopsy: None   PROBLEM LIST: Radial scar of the breast  GENERAL REVIEW OF SYSTEMS:   General ROS: negative for - chills, fatigue, fever, weight gain or weight loss Allergy and Immunology ROS: negative for - hives  Hematological and Lymphatic ROS: negative for - bleeding problems or bruising, negative for palpable nodes Endocrine ROS: negative for - heat or cold intolerance, hair changes Respiratory ROS: negative for - cough, shortness of breath or wheezing Cardiovascular ROS: no chest pain or palpitations GI ROS: negative for nausea, vomiting, abdominal pain, diarrhea, constipation Musculoskeletal ROS: negative for - joint swelling or muscle pain Neurological ROS: negative for - confusion, syncope Dermatological ROS: negative for pruritus and rash Psychiatric: negative for anxiety, depression, difficulty sleeping and memory loss  MEDICATIONS: Current Medications        Current Outpatient Medications  Medication Sig Dispense Refill  . ascorbic  acid, vitamin C, (VITAMIN C) 500 MG tablet Take 500 mg by mouth once daily    . cetirizine (ZYRTEC) 10 MG tablet daily    . cholecalciferol (VITAMIN D3) 2,000 unit capsule once daily    . COLLAGEN MISC Take by mouth once daily    . cyanocobalamin/folic acid (VITAMIN H82-XHBZJ ACID) 6,967-893 mcg Lozg once daily    . escitalopram oxalate (LEXAPRO) 10 MG tablet Take 5 mg by mouth as needed    . fluticasone propionate (FLONASE) 50 mcg/actuation nasal spray as needed    . ibuprofen (MOTRIN) 200 MG tablet Take by mouth as needed    . multivitamin tablet Take 1 tablet by mouth once daily    . zinc 50 mg Tab Take by mouth once daily     No current facility-administered medications for this visit.      ALLERGIES: Other and Sulfa (sulfonamide antibiotics)  PAST MEDICAL HISTORY: History reviewed. No pertinent past medical history.  PAST SURGICAL HISTORY:      Past Surgical History:  Procedure Laterality Date  . ankle surgery  2007   metal taken out in 2020  . EXCISION GANGLION CYST WRIST PRIMARY  2002  . HYSTERECTOMY  10/2004   @ [facility] --- TOTAL - Dr Dahlia Byes  . TONSILLECTOMY     age 11     FAMILY HISTORY:      Family History  Problem Relation Age of Onset  . Breast cancer Mother   . Arthritis Mother   . Heart valve disease Mother   . Lung cancer Father   . Coronary Artery Disease (Blocked arteries around heart) Father   . High blood pressure (Hypertension) Sister   . Arthritis Sister   .  Obesity Sister   . Heart valve disease Sister   . High blood pressure (Hypertension) Brother   . Obesity Brother   . Arthritis Maternal Aunt   . Heart valve disease Maternal Aunt   . Colon cancer Maternal Uncle   . Arthritis Maternal Grandmother   . High blood pressure (Hypertension) Son   . Obesity Son   . Asthma Son      SOCIAL HISTORY: Social History          Socioeconomic History  . Marital status: Unknown    Spouse  name: Not on file  . Number of children: Not on file  . Years of education: Not on file  . Highest education level: Not on file  Occupational History  . Not on file  Tobacco Use  . Smoking status: Never Smoker  . Smokeless tobacco: Never Used  Vaping Use  . Vaping Use: Never used  Substance and Sexual Activity  . Alcohol use: Yes    Alcohol/week: 14.0 standard drinks    Types: 14 Glasses of wine per week  . Drug use: Never  . Sexual activity: Not on file  Other Topics Concern  . Not on file  Social History Narrative  . Not on file   Social Determinants of Health   Financial Resource Strain: Not on file  Food Insecurity: Not on file  Transportation Needs: Not on file      PHYSICAL EXAM:    Vitals:   05/25/20 1415  BP: 134/82  Pulse: 73   Body mass index is 28.79 kg/m. Weight: 71.4 kg (157 lb 6.4 oz)   GENERAL: Alert, active, oriented x3  HEENT: Pupils equal reactive to light. Extraocular movements are intact. Sclera clear. Palpebral conjunctiva normal red color.Pharynx clear.  NECK: Supple with no palpable mass and no adenopathy.  LUNGS: Sound clear with no rales rhonchi or wheezes.  HEART: Regular rhythm S1 and S2 without murmur.  BREAST: left breast normal without mass, skin or nipple changes or axillary nodes, abnormal mass palpable on the right breast at 11:00 due to hematoma.  Moderate sized bruise.  No other abnormality palpated.  No axillary adenopathy..  ABDOMEN: Soft and depressible, nontender with no palpable mass, no hepatomegaly.  EXTREMITIES: Well-developed well-nourished symmetrical with no dependent edema.  NEUROLOGICAL: Awake alert oriented, facial expression symmetrical, moving all extremities.  REVIEW OF DATA: I have reviewed the following data today: No visits with results within 3 Month(s) from this visit.  Latest known visit with results is:  No results found for any previous visit.     ASSESSMENT: Dana Jarvis  is a 55 y.o. female presenting for consultation for right breast radial scar.    Patient was oriented again about the pathology results. Surgical alternatives were discussed with patient including excisional biopsy. Surgical technique and post operative care was discussed with patient. Risk of surgery was discussed with patient including but not limited to: wound infection, seroma, hematoma, brachial plexopathy, mondor's disease (thrombosis of small veins of breast), chronic wound pain, breast lymphedema, altered sensation to the nipple and cosmesis among others.   Radial scar of breast [N64.89]  PLAN: 1. Will proceed with radiofrequency tagged excisional biopsy of the right breast 2. Avoid taking aspirin 5 days before procedure 3. Contact us if you have any concern.   Patient verbalized understanding, all questions were answered, and were agreeable with the plan outlined above.     Herbert Pun, MD  Electronically signed by Herbert Pun, MD

## 2020-05-26 ENCOUNTER — Other Ambulatory Visit: Payer: Self-pay | Admitting: General Surgery

## 2020-05-26 DIAGNOSIS — N6489 Other specified disorders of breast: Secondary | ICD-10-CM

## 2020-06-01 ENCOUNTER — Other Ambulatory Visit: Payer: BC Managed Care – PPO

## 2020-06-01 ENCOUNTER — Other Ambulatory Visit: Payer: Self-pay

## 2020-06-01 DIAGNOSIS — E0590 Thyrotoxicosis, unspecified without thyrotoxic crisis or storm: Secondary | ICD-10-CM

## 2020-06-01 DIAGNOSIS — Z13220 Encounter for screening for lipoid disorders: Secondary | ICD-10-CM

## 2020-06-01 DIAGNOSIS — Z0000 Encounter for general adult medical examination without abnormal findings: Secondary | ICD-10-CM

## 2020-06-01 DIAGNOSIS — Z131 Encounter for screening for diabetes mellitus: Secondary | ICD-10-CM

## 2020-06-01 DIAGNOSIS — Z136 Encounter for screening for cardiovascular disorders: Secondary | ICD-10-CM

## 2020-06-02 LAB — COMPREHENSIVE METABOLIC PANEL
ALT: 16 IU/L (ref 0–32)
AST: 20 IU/L (ref 0–40)
Albumin/Globulin Ratio: 1.8 (ref 1.2–2.2)
Albumin: 4.4 g/dL (ref 3.8–4.9)
Alkaline Phosphatase: 73 IU/L (ref 44–121)
BUN/Creatinine Ratio: 28 — ABNORMAL HIGH (ref 9–23)
BUN: 18 mg/dL (ref 6–24)
Bilirubin Total: 0.4 mg/dL (ref 0.0–1.2)
CO2: 24 mmol/L (ref 20–29)
Calcium: 9.1 mg/dL (ref 8.7–10.2)
Chloride: 102 mmol/L (ref 96–106)
Creatinine, Ser: 0.65 mg/dL (ref 0.57–1.00)
Globulin, Total: 2.4 g/dL (ref 1.5–4.5)
Glucose: 97 mg/dL (ref 65–99)
Potassium: 4.5 mmol/L (ref 3.5–5.2)
Sodium: 142 mmol/L (ref 134–144)
Total Protein: 6.8 g/dL (ref 6.0–8.5)
eGFR: 103 mL/min/{1.73_m2} (ref >59)

## 2020-06-02 LAB — LIPID PANEL
Chol/HDL Ratio: 2.1 ratio (ref 0.0–4.4)
Cholesterol, Total: 225 mg/dL — ABNORMAL HIGH (ref 100–199)
HDL: 108 mg/dL (ref >39)
LDL Chol Calc (NIH): 109 mg/dL — ABNORMAL HIGH (ref 0–99)
Triglycerides: 46 mg/dL (ref 0–149)
VLDL Cholesterol Cal: 8 mg/dL (ref 5–40)

## 2020-06-02 LAB — CBC WITH DIFFERENTIAL/PLATELET
Basophils Absolute: 0.1 10*3/uL (ref 0.0–0.2)
Basos: 1 %
EOS (ABSOLUTE): 0.3 10*3/uL (ref 0.0–0.4)
Eos: 6 %
Hematocrit: 42.7 % (ref 34.0–46.6)
Hemoglobin: 14.2 g/dL (ref 11.1–15.9)
Immature Grans (Abs): 0 10*3/uL (ref 0.0–0.1)
Immature Granulocytes: 0 %
Lymphocytes Absolute: 2 10*3/uL (ref 0.7–3.1)
Lymphs: 44 %
MCH: 32.6 pg (ref 26.6–33.0)
MCHC: 33.3 g/dL (ref 31.5–35.7)
MCV: 98 fL — ABNORMAL HIGH (ref 79–97)
Monocytes Absolute: 0.4 10*3/uL (ref 0.1–0.9)
Monocytes: 8 %
Neutrophils Absolute: 1.9 10*3/uL (ref 1.4–7.0)
Neutrophils: 41 %
Platelets: 271 10*3/uL (ref 150–450)
RBC: 4.36 x10E6/uL (ref 3.77–5.28)
RDW: 11.1 % — ABNORMAL LOW (ref 11.7–15.4)
WBC: 4.6 10*3/uL (ref 3.4–10.8)

## 2020-06-02 LAB — HGB A1C W/O EAG: Hgb A1c MFr Bld: 5.1 % (ref 4.8–5.6)

## 2020-06-02 LAB — TSH: TSH: 1.54 u[IU]/mL (ref 0.450–4.500)

## 2020-06-05 NOTE — Progress Notes (Signed)
TSH for thyroid within normal limits.  Hemoglobin A1C is within normal limits no diabetes or prediabetes.   Total cholesterol and LDL elevated.  Discuss lifestyle modification with patient e.g. increase exercise, fiber, fruits, vegetables, lean meat, and omega 3/fish intake and decrease saturated fat.  If patient following strict diet and exercise program already please schedule follow up appointment with primary care physician  CMP ok.  CBC ok and stable.

## 2020-06-07 ENCOUNTER — Other Ambulatory Visit: Admission: RE | Admit: 2020-06-07 | Payer: BC Managed Care – PPO | Source: Ambulatory Visit

## 2020-06-08 ENCOUNTER — Other Ambulatory Visit: Payer: Self-pay | Admitting: Physician Assistant

## 2020-06-08 DIAGNOSIS — F4329 Adjustment disorder with other symptoms: Secondary | ICD-10-CM

## 2020-06-08 NOTE — Telephone Encounter (Signed)
   Notes to clinic: Review directions for refill Patient is taking different than prescribed    Requested Prescriptions  Pending Prescriptions Disp Refills   escitalopram (LEXAPRO) 10 MG tablet [Pharmacy Med Name: ESCITALOPRAM OXALATE 10 MG TAB] 90 tablet 1    Sig: TAKE ONE TABLET BY MOUTH EVERY DAY      Psychiatry:  Antidepressants - SSRI Passed - 06/08/2020  1:49 PM      Passed - Valid encounter within last 6 months    Recent Outpatient Visits           4 months ago Annual physical exam   [facility] [place], Clearnce Sorrel, [place]   1 year ago Annual physical exam   [facility] [place], Clearnce Sorrel, [place]   2 years ago Annual physical exam   [facility] [place], Clearnce Sorrel, [place]   3 years ago Annual physical exam   [facility] [place], Clearnce Sorrel, [place]   5 years ago Annual physical exam   [facility] Margarita Rana, MD       Future Appointments             In 7 months [place], Clearnce Sorrel, PA-C [employer], [place]

## 2020-06-12 ENCOUNTER — Other Ambulatory Visit: Payer: BC Managed Care – PPO

## 2020-06-12 ENCOUNTER — Ambulatory Visit
Admission: RE | Admit: 2020-06-12 | Discharge: 2020-06-12 | Disposition: A | Discharge status: Home / Self Care | Payer: BC Managed Care – PPO | Source: Ambulatory Visit | Attending: General Surgery | Admitting: General Surgery

## 2020-06-12 ENCOUNTER — Other Ambulatory Visit: Payer: Self-pay | Admitting: Obstetrics and Gynecology

## 2020-06-12 ENCOUNTER — Other Ambulatory Visit: Payer: Self-pay

## 2020-06-12 DIAGNOSIS — N6489 Other specified disorders of breast: Secondary | ICD-10-CM

## 2020-06-12 DIAGNOSIS — R928 Other abnormal and inconclusive findings on diagnostic imaging of breast: Secondary | ICD-10-CM | POA: Diagnosis not present

## 2020-06-14 ENCOUNTER — Ambulatory Visit
Admission: RE | Admit: 2020-06-14 | Discharge: 2020-06-14 | Disposition: A | Discharge status: Home / Self Care | Payer: BC Managed Care – PPO | Source: Ambulatory Visit | Attending: General Surgery | Admitting: General Surgery

## 2020-06-14 ENCOUNTER — Other Ambulatory Visit: Payer: Self-pay

## 2020-06-14 DIAGNOSIS — N6489 Other specified disorders of breast: Secondary | ICD-10-CM

## 2020-06-15 ENCOUNTER — Encounter
Admission: RE | Admit: 2020-06-15 | Discharge: 2020-06-15 | Disposition: A | Discharge status: Home / Self Care | Payer: BC Managed Care – PPO | Source: Ambulatory Visit | Attending: General Surgery | Admitting: General Surgery

## 2020-06-15 ENCOUNTER — Other Ambulatory Visit: Payer: Self-pay

## 2020-06-15 NOTE — Patient Instructions (Signed)
Your procedure is scheduled on:  Wednesday 06/21/20.  Report to THE FIRST FLOOR REGISTRATION DESK IN THE MEDICAL MALL ON THE MORNING OF SURGERY FIRST, THEN YOU WILL CHECK IN AT THE SURGERY INFORMATION DESK LOCATED OUTSIDE THE SAME DAY SURGERY DEPARTMENT LOCATED ON 2ND FLOOR MEDICAL MALL ENTRANCE.  To find out your arrival time please call [phone] between 1PM - 3PM on Tuesday 06/20/20.   Remember: Instructions that are not followed completely may result in serious medical risk, up to and including death, or upon the discretion of your surgeon and anesthesiologist your surgery may need to be rescheduled.     __X__ 1. Do not eat food after midnight the night before your procedure.                 No gum chewing or hard candies. You may drink clear liquids up to 2 hours                 before you are scheduled to arrive for your surgery- DO NOT drink clear                 liquids within 2 hours of the start of your surgery.                 Clear Liquids include:  water, apple juice without pulp, clear carbohydrate                 drink such as Clearfast or Gatorade, Black Coffee or Tea (Do not add                 milk or creamer to coffee or tea).  __X__2.  On the morning of surgery brush your teeth with toothpaste and water, you may rinse your mouth with mouthwash if you wish.  Do not swallow any toothpaste or mouthwash.    __X__ 3.  No Alcohol for 24 hours before or after surgery.  __X__ 4.  Do Not Smoke or use e-cigarettes For 24 Hours Prior to Your Surgery.                 Do not use any chewable tobacco products for at least 6 hours prior to                 surgery.  __X__5.  Notify your doctor if there is any change in your medical condition      (cold, fever, infections).      Do NOT wear jewelry, make-up, hairpins, clips or nail polish. Do NOT wear lotions, powders, or perfumes.  Do NOT shave 48 hours prior to surgery. Men may shave face and neck. Do NOT bring valuables to  the hospital.     [facility] is not responsible for any belongings or valuables.   Contacts, dentures/partials or body piercings may not be worn into surgery. Bring a case for your contacts, glasses or hearing aids, a denture cup will be supplied.    Patients discharged the day of surgery will not be allowed to drive home.     __X__ Take these medicines the morning of surgery with A SIP OF WATER:     1. cetirizine (ZYRTEC)     __X__ Shower with Dial Soap.  __X__ Stop Anti-inflammatories 7 days before surgery such as Advil, Ibuprofen, Motrin, BC or Goodies Powder, Naprosyn, Naproxen, Aleve, Aspirin, Meloxicam. May take Tylenol if needed for pain or discomfort.   __X__Do not start taking any new herbal supplements or  vitamins prior to your procedure.  __X__ Stop the following herbal supplements or vitamins:  vitamin C (ASCORBIC ACID)  zinc gluconate    Wear comfortable clothing (specific to your surgery type) to the hospital.  Plan for stool softeners for home use; pain medications have a tendency to cause constipation. You can also help prevent constipation by eating foods high in fiber such as fruits and vegetables and drinking plenty of fluids as your diet allows.  After surgery, you can prevent lung complications by doing breathing exercises.Take deep breaths and cough every 1-2 hours. Your doctor may order a device called an Incentive Spirometer to help you take deep breaths.  Please call the [facility] Department at [phone] if you have any questions about these instructions.

## 2020-06-19 ENCOUNTER — Other Ambulatory Visit
Admission: RE | Admit: 2020-06-19 | Discharge: 2020-06-19 | Disposition: A | Discharge status: Home / Self Care | Payer: BC Managed Care – PPO | Source: Ambulatory Visit | Attending: General Surgery | Admitting: General Surgery

## 2020-06-19 ENCOUNTER — Other Ambulatory Visit: Payer: Self-pay

## 2020-06-19 DIAGNOSIS — Z882 Allergy status to sulfonamides status: Secondary | ICD-10-CM | POA: Diagnosis not present

## 2020-06-19 DIAGNOSIS — Z801 Family history of malignant neoplasm of trachea, bronchus and lung: Secondary | ICD-10-CM | POA: Diagnosis not present

## 2020-06-19 DIAGNOSIS — Z01812 Encounter for preprocedural laboratory examination: Secondary | ICD-10-CM | POA: Insufficient documentation

## 2020-06-19 DIAGNOSIS — Z803 Family history of malignant neoplasm of breast: Secondary | ICD-10-CM | POA: Diagnosis not present

## 2020-06-19 DIAGNOSIS — Z800 Family history of malignant neoplasm of digestive organs: Secondary | ICD-10-CM | POA: Diagnosis not present

## 2020-06-19 DIAGNOSIS — Z791 Long term (current) use of non-steroidal anti-inflammatories (NSAID): Secondary | ICD-10-CM | POA: Diagnosis not present

## 2020-06-19 DIAGNOSIS — L905 Scar conditions and fibrosis of skin: Secondary | ICD-10-CM | POA: Diagnosis not present

## 2020-06-19 DIAGNOSIS — Z20822 Contact with and (suspected) exposure to covid-19: Secondary | ICD-10-CM | POA: Insufficient documentation

## 2020-06-19 DIAGNOSIS — Z8249 Family history of ischemic heart disease and other diseases of the circulatory system: Secondary | ICD-10-CM | POA: Diagnosis not present

## 2020-06-19 DIAGNOSIS — Z79899 Other long term (current) drug therapy: Secondary | ICD-10-CM | POA: Diagnosis not present

## 2020-06-19 DIAGNOSIS — N6489 Other specified disorders of breast: Secondary | ICD-10-CM | POA: Diagnosis not present

## 2020-06-19 DIAGNOSIS — Z8349 Family history of other endocrine, nutritional and metabolic diseases: Secondary | ICD-10-CM | POA: Diagnosis not present

## 2020-06-19 LAB — SARS CORONAVIRUS 2 (TAT 6-24 HRS): SARS Coronavirus 2: NEGATIVE

## 2020-06-21 ENCOUNTER — Other Ambulatory Visit: Payer: Self-pay

## 2020-06-21 ENCOUNTER — Ambulatory Visit: Payer: BC Managed Care – PPO

## 2020-06-21 ENCOUNTER — Ambulatory Visit
Admission: RE | Admit: 2020-06-21 | Discharge: 2020-06-21 | Disposition: A | Discharge status: Home / Self Care | Payer: BC Managed Care – PPO | Source: Ambulatory Visit | Attending: General Surgery | Admitting: General Surgery

## 2020-06-21 ENCOUNTER — Other Ambulatory Visit: Payer: Self-pay | Admitting: General Surgery

## 2020-06-21 ENCOUNTER — Ambulatory Visit
Admission: RE | Admit: 2020-06-21 | Discharge: 2020-06-21 | Disposition: A | Discharge status: Home / Self Care | Payer: BC Managed Care – PPO | Attending: General Surgery | Admitting: General Surgery

## 2020-06-21 ENCOUNTER — Encounter
Admission: RE | Disposition: A | Discharge status: Home / Self Care | Payer: Self-pay | Source: Home / Self Care | Attending: General Surgery

## 2020-06-21 ENCOUNTER — Encounter: Payer: Self-pay | Admitting: General Surgery

## 2020-06-21 DIAGNOSIS — Z800 Family history of malignant neoplasm of digestive organs: Secondary | ICD-10-CM | POA: Insufficient documentation

## 2020-06-21 DIAGNOSIS — Z8349 Family history of other endocrine, nutritional and metabolic diseases: Secondary | ICD-10-CM | POA: Insufficient documentation

## 2020-06-21 DIAGNOSIS — Z803 Family history of malignant neoplasm of breast: Secondary | ICD-10-CM | POA: Insufficient documentation

## 2020-06-21 DIAGNOSIS — Z791 Long term (current) use of non-steroidal anti-inflammatories (NSAID): Secondary | ICD-10-CM | POA: Diagnosis not present

## 2020-06-21 DIAGNOSIS — Z79899 Other long term (current) drug therapy: Secondary | ICD-10-CM | POA: Insufficient documentation

## 2020-06-21 DIAGNOSIS — Z8249 Family history of ischemic heart disease and other diseases of the circulatory system: Secondary | ICD-10-CM | POA: Diagnosis not present

## 2020-06-21 DIAGNOSIS — L905 Scar conditions and fibrosis of skin: Secondary | ICD-10-CM | POA: Diagnosis not present

## 2020-06-21 DIAGNOSIS — Z20822 Contact with and (suspected) exposure to covid-19: Secondary | ICD-10-CM | POA: Diagnosis not present

## 2020-06-21 DIAGNOSIS — Z882 Allergy status to sulfonamides status: Secondary | ICD-10-CM | POA: Insufficient documentation

## 2020-06-21 DIAGNOSIS — N6489 Other specified disorders of breast: Secondary | ICD-10-CM | POA: Insufficient documentation

## 2020-06-21 DIAGNOSIS — Z801 Family history of malignant neoplasm of trachea, bronchus and lung: Secondary | ICD-10-CM | POA: Diagnosis not present

## 2020-06-21 DIAGNOSIS — R928 Other abnormal and inconclusive findings on diagnostic imaging of breast: Secondary | ICD-10-CM | POA: Diagnosis not present

## 2020-06-21 HISTORY — PX: BREAST BIOPSY WITH RADIO FREQUENCY LOCALIZER: SHX6895

## 2020-06-21 HISTORY — PX: BREAST EXCISIONAL BIOPSY: SUR124

## 2020-06-21 SURGERY — BREAST BIOPSY WITH RADIO FREQUENCY LOCALIZER
Anesthesia: General | Laterality: Right

## 2020-06-21 MED ORDER — DEXAMETHASONE SODIUM PHOSPHATE 10 MG/ML IJ SOLN
INTRAMUSCULAR | Status: DC | PRN
  Administered 2020-06-21: 10 mg via INTRAVENOUS

## 2020-06-21 MED ORDER — PROPOFOL 10 MG/ML IV BOLUS
INTRAVENOUS | Status: AC
  Filled 2020-06-21: qty 20

## 2020-06-21 MED ORDER — ACETAMINOPHEN 10 MG/ML IV SOLN
INTRAVENOUS | Status: AC
  Filled 2020-06-21: qty 100

## 2020-06-21 MED ORDER — FAMOTIDINE 20 MG PO TABS
20.0000 mg | ORAL_TABLET | Freq: Once | ORAL | Status: AC
  Administered 2020-06-21: 20 mg via ORAL

## 2020-06-21 MED ORDER — ACETAMINOPHEN 10 MG/ML IV SOLN
INTRAVENOUS | Status: DC | PRN
  Administered 2020-06-21: 1000 mg via INTRAVENOUS

## 2020-06-21 MED ORDER — LIDOCAINE HCL (CARDIAC) PF 100 MG/5ML IV SOSY
PREFILLED_SYRINGE | INTRAVENOUS | Status: DC | PRN
  Administered 2020-06-21: 100 mg via INTRAVENOUS

## 2020-06-21 MED ORDER — CEFAZOLIN SODIUM-DEXTROSE 2-4 GM/100ML-% IV SOLN
INTRAVENOUS | Status: AC
  Filled 2020-06-21: qty 100

## 2020-06-21 MED ORDER — ONDANSETRON HCL 4 MG/2ML IJ SOLN
INTRAMUSCULAR | Status: AC
  Filled 2020-06-21: qty 2

## 2020-06-21 MED ORDER — LIDOCAINE HCL (PF) 2 % IJ SOLN
INTRAMUSCULAR | Status: AC
  Filled 2020-06-21: qty 5

## 2020-06-21 MED ORDER — DEXMEDETOMIDINE (PRECEDEX) IN NS 20 MCG/5ML (4 MCG/ML) IV SYRINGE
PREFILLED_SYRINGE | INTRAVENOUS | Status: DC | PRN
  Administered 2020-06-21: 8 ug via INTRAVENOUS
  Administered 2020-06-21: 20 ug via INTRAVENOUS
  Administered 2020-06-21: 4 ug via INTRAVENOUS

## 2020-06-21 MED ORDER — CEFAZOLIN SODIUM-DEXTROSE 2-4 GM/100ML-% IV SOLN
2.0000 g | INTRAVENOUS | Status: AC
  Administered 2020-06-21: 2 g via INTRAVENOUS

## 2020-06-21 MED ORDER — LACTATED RINGERS IV SOLN: INTRAVENOUS | Status: DC

## 2020-06-21 MED ORDER — FENTANYL CITRATE (PF) 100 MCG/2ML IJ SOLN
INTRAMUSCULAR | Status: AC
  Filled 2020-06-21: qty 2

## 2020-06-21 MED ORDER — EPHEDRINE 5 MG/ML INJ
INTRAVENOUS | Status: AC
  Filled 2020-06-21: qty 10

## 2020-06-21 MED ORDER — MIDAZOLAM HCL 2 MG/2ML IJ SOLN
INTRAMUSCULAR | Status: AC
  Filled 2020-06-21: qty 2

## 2020-06-21 MED ORDER — FENTANYL CITRATE (PF) 100 MCG/2ML IJ SOLN
25.0000 ug | INTRAMUSCULAR | Status: DC | PRN
  Administered 2020-06-21: 25 ug via INTRAVENOUS

## 2020-06-21 MED ORDER — ONDANSETRON HCL 4 MG/2ML IJ SOLN: 4.0000 mg | Freq: Once | INTRAMUSCULAR | Status: DC | PRN

## 2020-06-21 MED ORDER — DEXMEDETOMIDINE (PRECEDEX) IN NS 20 MCG/5ML (4 MCG/ML) IV SYRINGE
PREFILLED_SYRINGE | INTRAVENOUS | Status: AC
  Filled 2020-06-21: qty 5

## 2020-06-21 MED ORDER — PROPOFOL 10 MG/ML IV BOLUS
INTRAVENOUS | Status: DC | PRN
  Administered 2020-06-21: 30 ug/kg/min via INTRAVENOUS
  Administered 2020-06-21: 30 mg via INTRAVENOUS
  Administered 2020-06-21: 200 mg via INTRAVENOUS
  Administered 2020-06-21: 40 mg via INTRAVENOUS

## 2020-06-21 MED ORDER — HYDROCODONE-ACETAMINOPHEN 5-325 MG PO TABS: 1.0000 | ORAL_TABLET | ORAL | 0 refills | Status: AC | PRN

## 2020-06-21 MED ORDER — MIDAZOLAM HCL 2 MG/2ML IJ SOLN
INTRAMUSCULAR | Status: DC | PRN
  Administered 2020-06-21: 2 mg via INTRAVENOUS

## 2020-06-21 MED ORDER — BUPIVACAINE-EPINEPHRINE (PF) 0.5% -1:200000 IJ SOLN
INTRAMUSCULAR | Status: DC | PRN
  Administered 2020-06-21: 30 mL

## 2020-06-21 MED ORDER — FAMOTIDINE 20 MG PO TABS
ORAL_TABLET | ORAL | Status: AC
  Filled 2020-06-21: qty 1

## 2020-06-21 MED ORDER — ORAL CARE MOUTH RINSE: 15.0000 mL | Freq: Once | OROMUCOSAL | Status: DC

## 2020-06-21 MED ORDER — ONDANSETRON HCL 4 MG/2ML IJ SOLN
INTRAMUSCULAR | Status: DC | PRN
  Administered 2020-06-21: 4 mg via INTRAVENOUS

## 2020-06-21 MED ORDER — BUPIVACAINE-EPINEPHRINE (PF) 0.5% -1:200000 IJ SOLN
INTRAMUSCULAR | Status: AC
  Filled 2020-06-21: qty 30

## 2020-06-21 MED ORDER — FENTANYL CITRATE (PF) 100 MCG/2ML IJ SOLN
INTRAMUSCULAR | Status: DC | PRN
  Administered 2020-06-21: 100 ug via INTRAVENOUS

## 2020-06-21 MED ORDER — DEXAMETHASONE SODIUM PHOSPHATE 10 MG/ML IJ SOLN
INTRAMUSCULAR | Status: AC
  Filled 2020-06-21: qty 1

## 2020-06-21 MED ORDER — EPHEDRINE SULFATE 50 MG/ML IJ SOLN
INTRAMUSCULAR | Status: DC | PRN
  Administered 2020-06-21 (×2): 5 mg via INTRAVENOUS

## 2020-06-21 SURGICAL SUPPLY — 45 items
ADH SKN CLS APL DERMABOND .7 (GAUZE/BANDAGES/DRESSINGS) ×1
APL PRP STRL LF DISP 70% ISPRP (MISCELLANEOUS) ×1
BLADE SURG 15 STRL LF DISP TIS (BLADE) ×1 IMPLANT
BLADE SURG 15 STRL SS (BLADE) ×2
CANISTER SUCT 1200ML W/VALVE (MISCELLANEOUS) ×2 IMPLANT
CHLORAPREP W/TINT 26 (MISCELLANEOUS) ×2 IMPLANT
CNTNR SPEC 2.5X3XGRAD LEK (MISCELLANEOUS) ×1
CONT SPEC 4OZ STER OR WHT (MISCELLANEOUS) ×1
CONT SPEC 4OZ STRL OR WHT (MISCELLANEOUS) ×1
CONTAINER SPEC 2.5X3XGRAD LEK (MISCELLANEOUS) ×1 IMPLANT
COVER WAND RF STERILE (DRAPES) ×2 IMPLANT
DERMABOND ADVANCED (GAUZE/BANDAGES/DRESSINGS) ×1
DERMABOND ADVANCED .7 DNX12 (GAUZE/BANDAGES/DRESSINGS) ×1 IMPLANT
DEVICE DUBIN SPECIMEN MAMMOGRA (MISCELLANEOUS) ×2 IMPLANT
DRAPE LAPAROTOMY TRNSV 106X77 (MISCELLANEOUS) ×2 IMPLANT
ELECT CAUTERY BLADE TIP 2.5 (TIP) ×2
ELECT REM PT RETURN 9FT ADLT (ELECTROSURGICAL) ×2
ELECTRODE CAUTERY BLDE TIP 2.5 (TIP) ×1 IMPLANT
ELECTRODE REM PT RTRN 9FT ADLT (ELECTROSURGICAL) ×1 IMPLANT
GLOVE SURG ENC MOIS LTX SZ6.5 (GLOVE) ×2 IMPLANT
GLOVE SURG UNDER POLY LF SZ6.5 (GLOVE) ×2 IMPLANT
GOWN STRL REUS W/ TWL LRG LVL3 (GOWN DISPOSABLE) ×3 IMPLANT
GOWN STRL REUS W/TWL LRG LVL3 (GOWN DISPOSABLE) ×6
KIT MARKER MARGIN INK (KITS) IMPLANT
KIT TURNOVER KIT A (KITS) ×2 IMPLANT
LABEL OR SOLS (LABEL) ×2 IMPLANT
MANIFOLD NEPTUNE II (INSTRUMENTS) ×2 IMPLANT
MARGIN MAP 10MM (MISCELLANEOUS) ×2 IMPLANT
MARKER MARGIN CORRECT CLIP (MARKER) IMPLANT
NEEDLE HYPO 25X1 1.5 SAFETY (NEEDLE) ×2 IMPLANT
PACK BASIN MINOR ARMC (MISCELLANEOUS) ×2 IMPLANT
RETRACTOR RING XSMALL (MISCELLANEOUS) ×1 IMPLANT
RTRCTR WOUND ALEXIS 13CM XS SH (MISCELLANEOUS) ×2
SET LOCALIZER 20 PROBE US (MISCELLANEOUS) ×2 IMPLANT
SUT ETHILON 3-0 FS-10 30 BLK (SUTURE)
SUT MNCRL 4-0 (SUTURE) ×2
SUT MNCRL 4-0 27XMFL (SUTURE) ×1
SUT SILK 2 0 SH (SUTURE) ×2 IMPLANT
SUT VIC AB 3-0 SH 27 (SUTURE) ×2
SUT VIC AB 3-0 SH 27X BRD (SUTURE) ×1 IMPLANT
SUTURE EHLN 3-0 FS-10 30 BLK (SUTURE) IMPLANT
SUTURE MNCRL 4-0 27XMF (SUTURE) ×1 IMPLANT
SYR 10ML LL (SYRINGE) ×2 IMPLANT
SYR BULB IRRIG 60ML STRL (SYRINGE) ×2 IMPLANT
WATER STERILE IRR 1000ML POUR (IV SOLUTION) ×2 IMPLANT

## 2020-06-21 NOTE — Anesthesia Procedure Notes (Signed)
Procedure Name: LMA Insertion Date/Time: 06/21/2020 9:56 AM Performed by: Lerry Liner, CRNA Pre-anesthesia Checklist: Patient identified, Emergency Drugs available, Suction available, Patient being monitored and Timeout performed Patient Re-evaluated:Patient Re-evaluated prior to induction Oxygen Delivery Method: Circle system utilized Preoxygenation: Pre-oxygenation with 100% oxygen Induction Type: IV induction Ventilation: Mask ventilation without difficulty LMA: LMA inserted LMA Size: 4.0 Number of attempts: 1 Placement Confirmation: positive ETCO2 and breath sounds checked- equal and bilateral Tube secured with: Tape Dental Injury: Teeth and Oropharynx as per pre-operative assessment

## 2020-06-21 NOTE — Op Note (Signed)
Preoperative diagnosis: Right breast radial scar.  Postoperative diagnosis: Right breast radial scar.   Procedure: Right radiofrequency tag-localized excisional biopsy  Anesthesia: GETA  Surgeon: Dr. Windell Moment  Wound Classification: Clean  Indications: Patient is a 55 y.o. female with a nonpalpable right breast mass noted on mammography with core biopsy demonstrating radial scar requires radiofrequency tag-localized excisional biopsy to rule out malignancy.  Findings: 1. Specimen mammography shows marker and tag on specimen 2. No other palpable mass or lymph node identified.   Description of procedure: Preoperative radiofrequency tag localization was performed by radiology. Localization studies were reviewed. The patient was taken to the operating room and placed supine on the operating table, and after general anesthesia the right chest and axilla were prepped and draped in the usual sterile fashion. A time-out was completed verifying correct patient, procedure, site, positioning, and implant(s) and/or special equipment prior to beginning this procedure.  By comparing the localization studies and interrogation with Localizer device, the probable trajectory and location of the mass was visualized. A circumareolar skin incision was planned in such a way as to minimize the amount of dissection to reach the mass.  The skin incision was made. Flaps were raised and the location of the tag was confirmed with Localizer device confirmed. A 2-0 silk figure-of-eight stay suture was placed and used for retraction. Dissection was then taken down circumferentially, taking care to include the entire localizing tag and a wide margin of grossly normal tissue. The specimen and entire localizing tag were removed. The specimen was oriented and sent to radiology with the localization studies. Confirmation was received that the entire target lesion had been resected. The wound was irrigated. Hemostasis was  checked. The wound was closed with interrupted sutures of 3-0 Vicryl and a subcuticular suture of Monocryl 3-0. No attempt was made to close the dead space.   Specimen: Right breast excisional biopsy  Complications: None  Estimated Blood Loss: 10 mL

## 2020-06-21 NOTE — Transfer of Care (Signed)
Immediate Anesthesia Transfer of Care Note  Patient: Dana Jarvis  Procedure(s) Performed: EXCISION OF BREAST BIOPSY with Radiofrequency Tag (Right )  Patient Location: PACU  Anesthesia Type:General  Level of Consciousness: drowsy and patient cooperative  Airway & Oxygen Therapy: Patient Spontanous Breathing and Patient connected to face mask oxygen  Post-op Assessment: Report given to RN, Post -op Vital signs reviewed and stable and Patient moving all extremities  Post vital signs: Reviewed and stable  Last Vitals:  Vitals Value Taken Time  BP 92/76 06/21/20 1101  Temp    Pulse 86 06/21/20 1104  Resp 21 06/21/20 1104  SpO2 96 % 06/21/20 1104  Vitals shown include unvalidated device data.  Last Pain:  Vitals:   06/21/20 0916  TempSrc: Oral  PainSc: 0-No pain         Complications: No complications documented.

## 2020-06-21 NOTE — Discharge Instructions (Signed)
AMBULATORY SURGERY  DISCHARGE INSTRUCTIONS   1) The drugs that you were given will stay in your system until tomorrow so for the next 24 hours you should not:  A) Drive an automobile B) Make any legal decisions C) Drink any alcoholic beverage   2) You may resume regular meals tomorrow.  Today it is better to start with liquids and gradually work up to solid foods.  You may eat anything you prefer, but it is better to start with liquids, then soup and crackers, and gradually work up to solid foods.   3) Please notify your doctor immediately if you have any unusual bleeding, trouble breathing, redness and pain at the surgery site, drainage, fever, or pain not relieved by medication.    4) Additional Instructions:        Please contact your physician with any problems or Same Day Surgery at [phone], Monday through Friday 6 am to 4 pm, or [facility] number at [phone]. Diet: Resume home heart healthy regular diet.   Activity: No heavy lifting >20 pounds (children, pets, laundry, garbage) or strenuous activity until follow-up, but light activity and walking are encouraged. Do not drive or drink alcohol if taking narcotic pain medications.  Wound care: May shower with soapy water and pat dry (do not rub incisions), but no baths or submerging incision underwater until follow-up. (no swimming)   Medications: Resume all home medications. For mild to moderate pain: acetaminophen (Tylenol) or ibuprofen (if no kidney disease). Combining Tylenol with alcohol can substantially increase your risk of causing liver disease. Narcotic pain medications, if prescribed, can be used for severe pain, though may cause nausea, constipation, and drowsiness. Do not combine Tylenol and Norco within a 6 hour period as Norco contains Tylenol. If you do not need the narcotic pain medication, you do not need to fill the prescription.  Call office ([phone]) at any time if any  questions, worsening pain, fevers/chills, bleeding, drainage from incision site, or other concerns.  

## 2020-06-21 NOTE — Interval H&P Note (Signed)
History and Physical Interval Note:  06/21/2020 9:30 AM  Dana Jarvis  has presented today for surgery, with the diagnosis of N64.89 Radial scar of breast.  The various methods of treatment have been discussed with the patient and family. After consideration of risks, benefits and other options for treatment, the patient has consented to  Procedure(s): EXCISION OF BREAST BIOPSY with Radiofrequency Tag (Right) as a surgical intervention.  The patient's history has been reviewed, patient examined, no change in status, stable for surgery.  I have reviewed the patient's chart and labs.  Right breast marked in the pre procedure room. Questions were answered to the patient's satisfaction.     Herbert Pun

## 2020-06-22 LAB — SURGICAL PATHOLOGY

## 2020-06-26 ENCOUNTER — Encounter: Payer: Self-pay | Admitting: General Surgery

## 2020-06-26 NOTE — Anesthesia Preprocedure Evaluation (Signed)
Anesthesia Evaluation  Patient identified by MRN, date of birth, ID band Patient awake    Reviewed: Allergy & Precautions, NPO status , Patient's Chart, lab work & pertinent test results  History of Anesthesia Complications (+) AWARENESS UNDER ANESTHESIA and history of anesthetic complications  Airway Mallampati: II  TM Distance: >3 FB     Dental   Pulmonary neg pulmonary ROS,    Pulmonary exam normal        Cardiovascular negative cardio ROS Normal cardiovascular exam     Neuro/Psych  Headaches, negative psych ROS   GI/Hepatic GERD  ,  Endo/Other  Hyperthyroidism   Renal/GU      Musculoskeletal  (+) Arthritis ,   Abdominal Normal abdominal exam  (+)   Peds negative pediatric ROS (+)  Hematology   Anesthesia Other Findings   Reproductive/Obstetrics                             Anesthesia Physical Anesthesia Plan  ASA: II  Anesthesia Plan: General   Post-op Pain Management:    Induction: Intravenous  PONV Risk Score and Plan:   Airway Management Planned: LMA and Oral ETT  Additional Equipment:   Intra-op Plan:   Post-operative Plan: Extubation in OR  Informed Consent: I have reviewed the patients History and Physical, chart, labs and discussed the procedure including the risks, benefits and alternatives for the proposed anesthesia with the patient or authorized representative who has indicated his/her understanding and acceptance.     Dental advisory given  Plan Discussed with: CRNA and Surgeon  Anesthesia Plan Comments:         Anesthesia Quick Evaluation

## 2020-06-26 NOTE — Anesthesia Postprocedure Evaluation (Signed)
Anesthesia Post Note  Patient: Dana Jarvis  Procedure(s) Performed: EXCISION OF BREAST BIOPSY with Radiofrequency Tag (Right )  Patient location during evaluation: PACU Anesthesia Type: General Level of consciousness: awake and alert and oriented Pain management: pain level controlled Vital Signs Assessment: post-procedure vital signs reviewed and stable Respiratory status: spontaneous breathing Cardiovascular status: blood pressure returned to baseline Anesthetic complications: no   No complications documented.   Last Vitals:  Vitals:   06/21/20 1130 06/21/20 1144  BP:  119/61  Pulse:  (!) 59  Resp:  18  Temp: (!) 36.2 C 36.7 C  SpO2:  99%    Last Pain:  Vitals:   06/22/20 0849  TempSrc:   PainSc: 1                  Kelson Queenan

## 2020-07-19 ENCOUNTER — Ambulatory Visit: Payer: BC Managed Care – PPO | Admitting: Registered Nurse

## 2020-07-19 ENCOUNTER — Encounter: Payer: Self-pay | Admitting: Medical

## 2020-07-19 ENCOUNTER — Other Ambulatory Visit: Payer: Self-pay

## 2020-07-19 VITALS — BP 154/92 | HR 92 | Temp 98.1°F | Resp 16

## 2020-07-19 DIAGNOSIS — B372 Candidiasis of skin and nail: Secondary | ICD-10-CM

## 2020-07-19 DIAGNOSIS — B373 Candidiasis of vulva and vagina: Secondary | ICD-10-CM

## 2020-07-19 DIAGNOSIS — R030 Elevated blood-pressure reading, without diagnosis of hypertension: Secondary | ICD-10-CM

## 2020-07-19 DIAGNOSIS — B3731 Acute candidiasis of vulva and vagina: Secondary | ICD-10-CM

## 2020-07-19 MED ORDER — NYSTATIN 100000 UNIT/GM EX POWD: 1.0000 "application " | Freq: Three times a day (TID) | CUTANEOUS | 0 refills | Status: DC | PRN

## 2020-07-19 MED ORDER — FLUCONAZOLE 150 MG PO TABS: ORAL_TABLET | ORAL | 0 refills | Status: DC

## 2020-07-19 NOTE — Patient Instructions (Signed)
Skin Yeast Infection  A skin yeast infection is a condition in which there is an overgrowth of yeast (candida) that normally lives on the skin. This condition usually occurs in areas of the skin that are constantly warm and moist, such as the armpits or the groin. What are the causes? This condition is caused by a change in the normal balance of the yeast and bacteria that live on the skin. What increases the risk? You are more likely to develop this condition if you:  Are obese.  Are pregnant.  Take birth control pills.  Have diabetes.  Take antibiotic medicines.  Take steroid medicines.  Are malnourished.  Have a weak body defense system (immune system).  Are 57 years of age or older.  Wear tight clothing. What are the signs or symptoms? The most common symptom of this condition is itchiness in the affected area. Other symptoms include:  Red, swollen area of the skin.  Bumps on the skin. How is this diagnosed?  This condition is diagnosed with a medical history and physical exam.  Your health care provider may check for yeast by taking light scrapings of the skin to be viewed under a microscope. How is this treated? This condition is treated with medicine. Medicines may be prescribed or be available over the counter. The medicines may be:  Taken by mouth (orally).  Applied as a cream or powder to your skin. Follow these instructions at home:  Take or apply over-the-counter and prescription medicines only as told by your health care provider.  Maintain a healthy weight. If you need help losing weight, talk with your health care provider.  Keep your skin clean and dry.  If you have diabetes, keep your blood sugar under control.  Keep all follow-up visits as told by your health care provider. This is important.   Contact a health care provider if:  Your symptoms go away and then return.  Your symptoms do not get better with treatment.  Your symptoms get  worse.  Your rash spreads.  You have a fever or chills.  You have new symptoms.  You have new warmth or redness of your skin. Summary  A skin yeast infection is a condition in which there is an overgrowth of yeast (candida) that normally lives on the skin. This condition is caused by a change in the normal balance of the yeast and bacteria that live on the skin.  Take or apply over-the-counter and prescription medicines only as told by your health care provider.  Keep your skin clean and dry.  Contact a health care provider if your symptoms do not get better with treatment. This information is not intended to replace advice given to you by your health care provider. Make sure you discuss any questions you have with your health care provider. Document Revised: 08/05/2017 Document Reviewed: 08/05/2017 Elsevier Patient Education  2021 [place]. Vaginal Yeast Infection, Adult  Vaginal yeast infection is a condition that causes vaginal discharge as well as soreness, swelling, and redness (inflammation) of the vagina. This is a common condition. Some women get this infection frequently. What are the causes? This condition is caused by a change in the normal balance of the yeast (candida) and bacteria that live in the vagina. This change causes an overgrowth of yeast, which causes the inflammation. What increases the risk? The condition is more likely to develop in women who:  Take antibiotic medicines.  Have diabetes.  Take birth control pills.  Are pregnant.  Douche often.  Have a weak body defense system (immune system).  Have been taking steroid medicines for a long time.  Frequently wear tight clothing. What are the signs or symptoms? Symptoms of this condition include:  White, thick, creamy vaginal discharge.  Swelling, itching, redness, and irritation of the vagina. The lips of the vagina (vulva) may be affected as well.  Pain or a burning feeling while  urinating.  Pain during sex. How is this diagnosed? This condition is diagnosed based on:  Your medical history.  A physical exam.  A pelvic exam. Your health care provider will examine a sample of your vaginal discharge under a microscope. Your health care provider may send this sample for testing to confirm the diagnosis. How is this treated? This condition is treated with medicine. Medicines may be over-the-counter or prescription. You may be told to use one or more of the following:  Medicine that is taken by mouth (orally).  Medicine that is applied as a cream (topically).  Medicine that is inserted directly into the vagina (suppository). Follow these instructions at home: Lifestyle  Do not have sex until your health care provider approves. Tell your sex partner that you have a yeast infection. That person should go to his or her health care provider and ask if they should also be treated.  Do not wear tight clothes, such as pantyhose or tight pants.  Wear breathable cotton underwear. General instructions  Take or apply over-the-counter and prescription medicines only as told by your health care provider.  Eat more yogurt. This may help to keep your yeast infection from returning.  Do not use tampons until your health care provider approves.  Try taking a sitz bath to help with discomfort. This is a warm water bath that is taken while you are sitting down. The water should only come up to your hips and should cover your buttocks. Do this 3-4 times per day or as told by your health care provider.  Do not douche.  If you have diabetes, keep your blood sugar levels under control.  Keep all follow-up visits as told by your health care provider. This is important.   Contact a health care provider if:  You have a fever.  Your symptoms go away and then return.  Your symptoms do not get better with treatment.  Your symptoms get worse.  You have new symptoms.  You  develop blisters in or around your vagina.  You have blood coming from your vagina and it is not your menstrual period.  You develop pain in your abdomen. Summary  Vaginal yeast infection is a condition that causes discharge as well as soreness, swelling, and redness (inflammation) of the vagina.  This condition is treated with medicine. Medicines may be over-the-counter or prescription.  Take or apply over-the-counter and prescription medicines only as told by your health care provider.  Do not douche. Do not have sex or use tampons until your health care provider approves.  Contact a health care provider if your symptoms do not get better with treatment or your symptoms go away and then return. This information is not intended to replace advice given to you by your health care provider. Make sure you discuss any questions you have with your health care provider. Document Revised: 10/16/2018 Document Reviewed: 08/04/2017 Elsevier Patient Education  [place].

## 2020-07-19 NOTE — Progress Notes (Signed)
Subjective:    Patient ID: Dana Jarvis, female    DOB: 1966/03/18, 55 y.o.   MRN: [id number]  54y/o caucasian female established patient here for evaluation rash under breasts, in belly button and vaginal yeast discharge for weeks now.  Started after her breast surgery last month and getting worse.  "Typically I never get yeast infection unless I took antibiotics and I didn't take any by mouth recently"  Started new job and her empty position not filled in department at this time.  Sweats easily and works out every day typically.  Has been cleaning belly button with qtip daily and putting neosporin in belly button due to yellow smelly discharge noted.  Rashes worsening under breasts and belly button and that is why she came in today.  Denied injury/bleeding/bruising.  Applies fragrance pump lotion to moisturize skin daily.     Review of Systems  Constitutional: Negative for activity change, appetite change, chills, diaphoresis, fatigue and fever.  HENT: Negative for trouble swallowing and voice change.   Eyes: Negative for photophobia and visual disturbance.  Respiratory: Negative for shortness of breath, wheezing and stridor.   Cardiovascular: Negative for chest pain.  Gastrointestinal: Negative for diarrhea, nausea and vomiting.  Endocrine: Negative for cold intolerance and heat intolerance.  Genitourinary: Positive for vaginal discharge. Negative for difficulty urinating, pelvic pain, vaginal bleeding and vaginal pain.  Musculoskeletal: Negative for gait problem, myalgias, neck pain and neck stiffness.  Skin: Positive for color change and rash. Negative for pallor and wound.  Allergic/Immunologic: Positive for environmental allergies. Negative for food allergies.  Neurological: Negative for dizziness, tremors, seizures, syncope, facial asymmetry, speech difficulty, weakness, light-headedness, numbness and headaches.  Hematological: Negative for adenopathy. Does not bruise/bleed easily.   Psychiatric/Behavioral: Negative for agitation, confusion and sleep disturbance.       Objective:   Physical Exam Vitals and nursing note reviewed.  Constitutional:      General: She is awake. She is not in acute distress.    Appearance: Normal appearance. She is well-developed, well-groomed and normal weight. She is not ill-appearing, toxic-appearing or diaphoretic.  HENT:     Head: Normocephalic and atraumatic.     Jaw: There is normal jaw occlusion.     Salivary Glands: Right salivary gland is not diffusely enlarged or tender. Left salivary gland is not diffusely enlarged or tender.     Right Ear: Hearing and external ear normal.     Left Ear: Hearing and external ear normal.     Nose: Nose normal. No congestion or rhinorrhea.     Mouth/Throat:     Pharynx: Oropharynx is clear.  Eyes:     General: Lids are normal. Vision grossly intact. Gaze aligned appropriately. Allergic shiner present. No visual field deficit or scleral icterus.       Right eye: No discharge.        Left eye: No discharge.     Extraocular Movements: Extraocular movements intact.     Conjunctiva/sclera: Conjunctivae normal.     Pupils: Pupils are equal, round, and reactive to light.  Neck:     Trachea: Trachea and phonation normal. No tracheal deviation.  Cardiovascular:     Rate and Rhythm: Normal rate and regular rhythm.     Pulses: Normal pulses.          Radial pulses are 2+ on the right side and 2+ on the left side.  Pulmonary:     Effort: Pulmonary effort is normal. No respiratory distress.  Breath sounds: Normal breath sounds and air entry. No stridor or transmitted upper airway sounds. No decreased breath sounds or wheezing.     Comments: Wearing cloth mask due to covid pandemic; spoke full sentences without difficulty; no cough observed in exam room Abdominal:     General: Abdomen is flat. There is no distension.     Palpations: Abdomen is soft.     Tenderness: There is no abdominal  tenderness. There is no guarding.    Genitourinary:    Comments: Deferred pelvic exam Musculoskeletal:        General: No swelling, tenderness, deformity or signs of injury. Normal range of motion.     Right shoulder: No swelling, deformity, effusion or laceration. Normal range of motion.     Left shoulder: No swelling, deformity, effusion or laceration. Normal range of motion.     Right elbow: No swelling, deformity, effusion or lacerations. Normal range of motion.     Left elbow: No swelling, deformity, effusion or lacerations. Normal range of motion.     Right hand: No swelling, deformity or lacerations. Normal range of motion. Normal strength.     Left hand: No swelling, deformity or lacerations. Normal range of motion. Normal strength.     Cervical back: Normal range of motion and neck supple. No swelling, edema, deformity, erythema, signs of trauma, lacerations, rigidity, tenderness or crepitus. Normal range of motion.     Thoracic back: No swelling, edema, deformity, signs of trauma or lacerations. Normal range of motion.     Lumbar back: No swelling, edema, deformity, signs of trauma or lacerations. Normal range of motion.     Right lower leg: No edema.     Left lower leg: No edema.     Right ankle: No swelling.     Left ankle: No swelling.     Comments: Standing to sitting to supine and reversed quickly on exam table without assistance  Lymphadenopathy:     Head:     Right side of head: No preauricular adenopathy.     Left side of head: No preauricular adenopathy.     Cervical: No cervical adenopathy.     Right cervical: No superficial cervical adenopathy.    Left cervical: No superficial cervical adenopathy.  Skin:    General: Skin is warm.     Capillary Refill: Capillary refill takes less than 2 seconds.     Coloration: Skin is not ashen, cyanotic, jaundiced, mottled, pale or sallow.     Findings: Erythema and rash present. No abrasion, abscess, acne, bruising, burn,  ecchymosis, signs of injury, laceration, lesion, petechiae or wound. Rash is macular and papular. Rash is not crusting, nodular, purpuric, pustular, scaling, urticarial or vesicular.     Nails: There is no clubbing.       Neurological:     General: No focal deficit present.     Mental Status: She is alert and oriented to person, place, and time. Mental status is at baseline.     GCS: GCS eye subscore is 4. GCS verbal subscore is 5. GCS motor subscore is 6.     Cranial Nerves: Cranial nerves are intact. No cranial nerve deficit, dysarthria or facial asymmetry.     Sensory: Sensation is intact. No sensory deficit.     Motor: Motor function is intact. No weakness, tremor, atrophy, abnormal muscle tone or seizure activity.     Coordination: Coordination is intact. Coordination normal.     Gait: Gait is intact. Gait normal.  Comments: Gait sure and steady in clinic; bilateral hand grasp equal 5/5; on/off exam table without difficulty  Psychiatric:        Attention and Perception: Attention and perception normal.        Mood and Affect: Mood and affect normal.        Speech: Speech normal.        Behavior: Behavior normal. Behavior is cooperative.        Thought Content: Thought content normal.        Cognition and Memory: Cognition and memory normal.        Judgment: Judgment normal.     Comments: Patient reported not taking lexapro every day as prescribed           Assessment & Plan:  A-yeast vaginitis acute, acute yeast infection of skin, elevated blood pressure without diagnosis of hypertension  P-Discussed with patient probably had IV antibiotics with breast surgery and newly promoted at work increased stressed/immune response.  Patient has used diflucan oral in the past.  Electronic Rx to her pharmacy of choice diflucan 150mg  po now and may repeat if needed in 72 hours #2 RF0.  Discussed with patient diflucan can increase blood level of lexapro.  She stated she was not planning to  take her lexapro tonight so she will take diflucan today after she picks up from pharmacy.  Exitcare handout on vaginal yeast infection printed and given to patient.  Follow up for re-evaluation if no improvement in symptoms after 1 week.  Patient verbalized understanding information/instructions, agreed with plan of care and had no further questions at this time.  Nystatin powder apply 1-3 times per day topical as needed #15gm RF0 electronic rx sent to her pharmacy of choice.  Discussed do not sit in sweaty clothes/bras.  Pat skin dry after shower.  May apply emollient to skin after shower recommend vaseline/aquaphor/eucerin scoop or squeeze container no fragrance.  Stop neosporin application to affected areas.  If purulent discharge or rash spreads/worsens to notify clinic staff.  Exitcare handout printed and given on yeast skin infection.  Patient verbalized understanding information/instructions, agreed with plan of care and had no further questions at this time.  Patient stressed at work today and worried about her rashes in clinic today will check BP again at next visit.  Couldn't do her regular work out routine after breast surgery.  Patient verbalized understanding information/instructions, agreed with plan of care and had no further questions at this time.

## 2020-09-07 ENCOUNTER — Other Ambulatory Visit: Payer: Self-pay

## 2020-09-07 ENCOUNTER — Ambulatory Visit: Payer: BC Managed Care – PPO | Admitting: Nurse Practitioner

## 2020-09-07 VITALS — BP 152/88 | HR 79 | Temp 97.9°F | Resp 16 | Wt 162.8 lb

## 2020-09-07 DIAGNOSIS — B379 Candidiasis, unspecified: Secondary | ICD-10-CM

## 2020-09-07 MED ORDER — KETOCONAZOLE 2 % EX CREA: 1.0000 "application " | TOPICAL_CREAM | Freq: Every day | CUTANEOUS | 2 refills | Status: AC

## 2020-09-07 NOTE — Progress Notes (Signed)
Subjective:    Patient ID: Dana Jarvis, female    DOB: 05/22/1965, 55 y.o.   MRN: [id number]  HPI  55 year old female presenting to [facility] with complaints of a rash present for 4 months now.   She was seen in office 07/19/20 for evaluation of rash.  At that time was give 2 doses of Diflucan oral and nystatin topical.  She does not feel that the rash improved at all.   The rash is under breasts bilaterally in abdominal fold and umbilical region.  Rash is red, itches at times and may also be sore. She noticed that her skin has started to peel away in areas.   She has also tried essential oils in the past weeks for relief.   Prior to the onset of the rash she got a promotion at work, but was also still performing her old job awaiting replacement. She admits this was a high stress time.  She was exercising regularly prior to this and has since not been able to exercise as regularly.   She had a total hysterectomy   Gets a regular physical with PCP and no evidence of DM on most recent bloodwork from March 2022.   She also had two breast surgeries- first was needle biopsy in February followed by a right sided excisional biopsy 06/21/20.  Did start taking collagen prior to rash onset. Taking for hair and nails along with inflammatory benefits but does not believe she has felt a difference.   Today's Vitals   09/07/20 0839  BP: (!) 152/88  Pulse: 79  Resp: 16  Temp: 97.9 F (36.6 C)  TempSrc: Tympanic  SpO2: 98%  Weight: 162 lb 12.8 oz (73.8 kg)   Body mass index is 29.78 kg/m.  Review of Systems  Constitutional: Negative.   HENT: Negative.    Skin:  Positive for rash.  Neurological: Negative.     Current Outpatient Medications  Medication Instructions   cetirizine (ZYRTEC) 10 mg, Oral, Daily   Cholecalciferol 2,000 Units, Oral, Daily   escitalopram (LEXAPRO) 10 MG tablet TAKE ONE TABLET BY MOUTH EVERY DAY   fluticasone (FLONASE) 50 MCG/ACT nasal  spray 1 spray, Each Nare, 2 times daily PRN   ibuprofen (ADVIL) 200 mg, Oral, Every 6 hours PRN   ketoconazole (NIZORAL) 2 % cream 1 application, Topical, Daily   Multiple Vitamins-Minerals (MULTIVITAMIN WITH MINERALS) tablet 1 tablet, Oral, Daily   vitamin B-12 (CYANOCOBALAMIN) 1,000 mcg, Oral, Daily       Objective:   Physical Exam HENT:     Head: Normocephalic.  Skin:    General: Skin is warm.     Findings: Erythema and rash present. Rash is macular.          Comments: Erythematous shinny rash with centralized peeling of skin along underneath of breasts bilaterally and abdomin worse on left. Erythema is contained to folds does not extend beyond. Skin temperature uniform throughout   Neurological:     Mental Status: She is alert.          Assessment & Plan:  Rash appears fungal   Discussed: Keeping area dry Wearing cotton undergarments.  Sleeping without underwear bra in loose fitting cotton shirt to allow more air to areas Change immediately after exercise or anytime sweat is in irritated areas.   Stop collagen as potential irritant.  Concentrate on low sugar/carb diet  Start topical antifungal cream  Wash only with unscented soaps  May start probiotic  oral supplement or foods with live cultures to help with pH balance.   Will try lifestyle changes and new medication for 2-4 weeks if no improvement move forward with dermatology referral.  Patient will return to clinic as needed.   Discussed consistent use of medication beyond symptom resolution will be necessary to resolve fungal infection.

## 2020-09-28 ENCOUNTER — Telehealth: Payer: Self-pay | Admitting: Nurse Practitioner

## 2020-09-28 DIAGNOSIS — R21 Rash and other nonspecific skin eruption: Secondary | ICD-10-CM

## 2020-09-28 NOTE — Telephone Encounter (Signed)
Patient has been followed at [facility] for the past 2 months regarding ongoing rash. After attempted treatment with Nystatin, Diflucan and Ketoconazole without improvement she will be referred out to dermatology for specialist opinion and management. Office notes faxed to [facility] in [place], [place]

## 2020-09-29 ENCOUNTER — Telehealth: Payer: Self-pay | Admitting: *Deleted

## 2020-09-29 NOTE — Telephone Encounter (Signed)
Please advise? See notes from [facility] yesterday. Marland Kitchen

## 2020-09-29 NOTE — Telephone Encounter (Signed)
I don't see any availability. May want [facility] vs [facility] before the holiday weekend

## 2020-09-29 NOTE — Telephone Encounter (Signed)
Copied from [place] [id number]. Topic: General - Other >> Sep 28, 2020  2:59 PM Leward Quan A wrote: Reason for CRM: Patient called in to say that she have a skin infection with cracked and open raw area under her breasts and under her abdomen need an appointment ASAP. Per patient she was seen at the clinic at [facility] where she work but what they are treating her with is not helping and it will be a few weeks before she can see a dermatologist.  please call Ph# [phone]

## 2020-10-03 NOTE — Telephone Encounter (Signed)
Patient scheduled to be seen at [facility] at 10 am with Dr. Neomia Dear.

## 2020-10-04 ENCOUNTER — Other Ambulatory Visit: Payer: Self-pay

## 2020-10-04 ENCOUNTER — Encounter: Payer: Self-pay | Admitting: Internal Medicine

## 2020-10-04 ENCOUNTER — Ambulatory Visit: Payer: BC Managed Care – PPO | Admitting: Internal Medicine

## 2020-10-04 VITALS — BP 154/88 | HR 64 | Temp 97.9°F | Wt 162.2 lb

## 2020-10-04 DIAGNOSIS — B356 Tinea cruris: Secondary | ICD-10-CM

## 2020-10-04 MED ORDER — FLUCONAZOLE 150 MG PO TABS: 150.0000 mg | ORAL_TABLET | Freq: Every day | ORAL | 0 refills | Status: DC

## 2020-10-04 NOTE — Progress Notes (Signed)
BP (!) 154/88   Pulse 64   Temp 97.9 F (36.6 C) (Oral)   Wt 162 lb 3.2 oz (73.6 kg)   LMP  (LMP Unknown)   SpO2 98%   BMI 29.67 kg/m    Subjective:    Patient ID: Dana Jarvis, female    DOB: [birth year], 54 y.o.   MRN: [id number]  Chief Complaint  Patient presents with  . Rash    Under breasts and in under stomach since Feb., and in belly button. See dr at [facility] and was given Ketoconazole cream for rash, she states it is not working only making it worse.     HPI: Dana Jarvis is a 55 y.o. female  Pt is with Bfp but hasnt seen pcp sec to not being available. Has had yeast infection and in her belly button , denies fever or chills.  Had a lumpectomy in march , diagnosed in feb , rashes started since then. Has been stressful has had a new job as well and has been stressed out.  Rash This is a new problem.   Chief Complaint  Patient presents with  . Rash    Under breasts and in under stomach since Feb., and in belly button. See dr at [facility] and was given Ketoconazole cream for rash, she states it is not working only making it worse.     Relevant past medical, surgical, family and social history reviewed and updated as indicated. Interim medical history since our last visit reviewed. Allergies and medications reviewed and updated.  Review of Systems  Skin:  Positive for rash.   Per HPI unless specifically indicated above     Objective:    BP (!) 154/88   Pulse 64   Temp 97.9 F (36.6 C) (Oral)   Wt 162 lb 3.2 oz (73.6 kg)   LMP  (LMP Unknown)   SpO2 98%   BMI 29.67 kg/m   Wt Readings from Last 3 Encounters:  10/04/20 162 lb 3.2 oz (73.6 kg)  09/07/20 162 lb 12.8 oz (73.8 kg)  06/21/20 158 lb (71.7 kg)    Physical Exam Constitutional:      Appearance: Normal appearance. She is normal weight.  HENT:     Head: Normocephalic and atraumatic.  Skin:    Findings: Erythema and rash present.     Comments: Rash noted under bil breasts and around  the  subumbilical area Ill-defined erythema and scale noted  Neurological:     Mental Status: She is alert.   Results for orders placed or performed during the hospital encounter of 06/21/20  Surgical pathology  Result Value Ref Range   SURGICAL PATHOLOGY      SURGICAL PATHOLOGY CASE: [id number] PATIENT: [name] Surgical Pathology Report     Specimen Submitted: A. Breast, right  Clinical History: N64.89 radial scar of breast      DIAGNOSIS: A. RIGHT BREAST; EXCISIONAL BIOPSY: - BENIGN BREAST TISSUE WITH:      - PRIOR BIOPSY SITE CHANGES      - FIBROCYSTIC CHANGES WITH APOCRINE METAPLASIA      - FIBROADENOMATOID CHANGES      - PSEUDOANGIOMATOUS STROMAL HYPERPLASIA ([place])      - USUAL DUCTAL HYPERPLASIA - NEGATIVE FOR ATYPIA AND MALIGNANCY.   GROSS DESCRIPTION: A. Labeled: Right breast excisional biopsy Received: Fresh Specimen radiograph image(s) available for review Radiographic findings: An X shaped clip and RF ID tag are present. Time in fixative: Per requisition collected at 10:42  AM on 06/21/2020 and placed in formalin at 10:46 AM on 06/21/2020  Cold ischemic time: Less than 5 minutes Total fixation time: 11 hours Type of procedure: Excisional breast biopsy Location / laterality of specimen: Ri ght breast Orientation of specimen: The specimen is received inked and is oriented with clear plastic clips labeled as follows: A, M, L, S, and I. Inking: Anterior = green Inferior = blue Lateral = orange Medial = yellow Posterior = black Superior = red Size of specimen: 6.8 (medial to lateral) x 6.4 (superior to inferior) x 2.2 (anterior to posterior) cm Skin: None grossly appreciated.  Biopsy site: There is a hemorrhagic biopsy site with an X shaped clip.  Number of discrete masses: None Description of tissue: At the biopsy site there is a 2.2 x 1.4 x 1.2 cm area of hemorrhage.  The clip is located at the periphery of this area of hemorrhage.  The  remainder of the specimen is comprised of yellow lobulated adipose tissue admixed with tan-white firm fibrous tissue. The fat to fibrous tissue ratio is 70:30.  Block summary (specimen submitted entirely): 1 - 3 - lateral margin, perpendicularly sectioned 4 - 42 - central sections, submitted entirely and sequentiall y from lateral to medial      10 - 37 - area of hemorrhage      21 - clip site 43 - 47 - medial margin, perpendicularly sectioned  RB 06/21/2020  Final Diagnosis performed by Betsy Pries, MD.   Electronically signed 06/22/2020 5:01:18PM The electronic signature indicates that the named Attending Pathologist has evaluated the specimen Technical component performed at [facility], [address], [place], [place] [address] Lab: [phone] Dir: Rush Farmer, MD, MMM  Professional component performed at [facility], [facility], [place], [place], [place] [address] Lab: [phone] Dir: Dellia Nims. Rubinas, MD         Current Outpatient Medications:  .  cetirizine (ZYRTEC) 10 MG tablet, Take 10 mg by mouth daily., Disp: , Rfl:  .  Cholecalciferol 50 MCG (2000 UT) TBDP, Take 2,000 Units by mouth daily., Disp: , Rfl:  .  escitalopram (LEXAPRO) 10 MG tablet, TAKE ONE TABLET BY MOUTH EVERY DAY, Disp: 90 tablet, Rfl: 1 .  ibuprofen (ADVIL) 200 MG tablet, Take 200 mg by mouth every 6 (six) hours as needed., Disp: , Rfl:  .  ketoconazole (NIZORAL) 2 % cream, Apply 1 application topically daily., Disp: 15 g, Rfl: 2 .  Multiple Vitamins-Minerals (MULTIVITAMIN WITH MINERALS) tablet, Take 1 tablet by mouth daily., Disp: , Rfl:  .  vitamin B-12 (CYANOCOBALAMIN) 1000 MCG tablet, Take 1,000 mcg by mouth daily., Disp: , Rfl:  .  fluticasone (FLONASE) 50 MCG/ACT nasal spray, Place 1 spray into both nostrils 2 (two) times daily as needed. (Patient not taking: Reported on 10/04/2020), Disp: , Rfl:     Assessment & Plan:  Rash sec to fungal skin infection Tinea cruris /  corporis  Intertrigous areas  A1c at 5.1  Will start pt on diflucan x 1 week  Use otc antifungal powder for the areas affected.     Problem List Items Addressed This Visit   None    No orders of the defined types were placed in this encounter.    No orders of the defined types were placed in this encounter.    Follow up plan: No follow-ups on file.

## 2020-10-11 ENCOUNTER — Other Ambulatory Visit: Payer: Self-pay

## 2020-10-11 ENCOUNTER — Encounter: Payer: Self-pay | Admitting: Internal Medicine

## 2020-10-11 ENCOUNTER — Ambulatory Visit: Payer: BC Managed Care – PPO | Admitting: Internal Medicine

## 2020-10-11 VITALS — BP 128/80 | HR 58 | Temp 98.4°F | Ht 62.01 in | Wt 161.4 lb

## 2020-10-11 DIAGNOSIS — Z0000 Encounter for general adult medical examination without abnormal findings: Secondary | ICD-10-CM

## 2020-10-11 DIAGNOSIS — R21 Rash and other nonspecific skin eruption: Secondary | ICD-10-CM

## 2020-10-11 DIAGNOSIS — R350 Frequency of micturition: Secondary | ICD-10-CM

## 2020-10-11 DIAGNOSIS — N898 Other specified noninflammatory disorders of vagina: Secondary | ICD-10-CM | POA: Diagnosis not present

## 2020-10-11 DIAGNOSIS — Z136 Encounter for screening for cardiovascular disorders: Secondary | ICD-10-CM | POA: Diagnosis not present

## 2020-10-11 LAB — URINALYSIS, ROUTINE W REFLEX MICROSCOPIC
Bilirubin, UA: NEGATIVE
Glucose, UA: NEGATIVE
Ketones, UA: NEGATIVE
Nitrite, UA: NEGATIVE
Protein,UA: NEGATIVE
Specific Gravity, UA: 1.01 (ref 1.005–1.030)
Urobilinogen, Ur: 0.2 mg/dL (ref 0.2–1.0)
pH, UA: 7 (ref 5.0–7.5)

## 2020-10-11 LAB — BAYER DCA HB A1C WAIVED: HB A1C (BAYER DCA - WAIVED): 4.9 % (ref <7.0)

## 2020-10-11 LAB — WET PREP FOR TRICH, YEAST, CLUE
Clue Cell Exam: NEGATIVE
Trichomonas Exam: NEGATIVE
Yeast Exam: NEGATIVE

## 2020-10-11 LAB — MICROSCOPIC EXAMINATION: Bacteria, UA: NONE SEEN

## 2020-10-11 MED ORDER — FLUCONAZOLE 150 MG PO TABS
150.0000 mg | ORAL_TABLET | Freq: Every day | ORAL | 0 refills | Status: AC
Start: 2020-10-11 — End: 2020-10-25

## 2020-10-11 MED ORDER — FEXOFENADINE HCL 180 MG PO TABS: 180.0000 mg | ORAL_TABLET | Freq: Every day | ORAL | 1 refills | Status: DC

## 2020-10-11 NOTE — Progress Notes (Signed)
BP 128/80   Pulse (!) 58   Temp 98.4 F (36.9 C) (Oral)   Ht 5' 2.01" (1.575 m)   Wt 161 lb 6.4 oz (73.2 kg)   LMP  (LMP Unknown)   SpO2 98%   BMI 29.51 kg/m    Subjective:    Patient ID: Dana Jarvis, female    DOB: 1965/11/09, 55 y.o.   MRN: [id number]  Chief Complaint  Patient presents with   Tinea Cruris    Patient states that rash is no better     HPI: Dana Jarvis is a 55 y.o. female  Rash This is a recurrent problem. The current episode started more than 1 month ago. The problem has been waxing and waning since onset. The rash is characterized by redness and itchiness. Pertinent negatives include no anorexia, congestion, cough, diarrhea, facial edema, fatigue, fever, rhinorrhea, shortness of breath or sore throat.   Chief Complaint  Patient presents with   Tinea Cruris    Patient states that rash is no better     Relevant past medical, surgical, family and social history reviewed and updated as indicated. Interim medical history since our last visit reviewed. Allergies and medications reviewed and updated.  Review of Systems  Constitutional:  Negative for fatigue and fever.  HENT:  Negative for congestion, rhinorrhea and sore throat.   Respiratory:  Negative for cough and shortness of breath.   Gastrointestinal:  Negative for anorexia and diarrhea.  Skin:  Positive for rash.   Per HPI unless specifically indicated above     Objective:    BP 128/80   Pulse (!) 58   Temp 98.4 F (36.9 C) (Oral)   Ht 5' 2.01" (1.575 m)   Wt 161 lb 6.4 oz (73.2 kg)   LMP  (LMP Unknown)   SpO2 98%   BMI 29.51 kg/m   Wt Readings from Last 3 Encounters:  10/11/20 161 lb 6.4 oz (73.2 kg)  10/04/20 162 lb 3.2 oz (73.6 kg)  09/07/20 162 lb 12.8 oz (73.8 kg)    Physical Exam Constitutional:      General: She is not in acute distress.    Appearance: Normal appearance. She is obese. She is not ill-appearing, toxic-appearing or diaphoretic.  Skin:    Findings:  Erythema and rash present.  Neurological:     Mental Status: She is alert.    Results for orders placed or performed during the hospital encounter of 06/21/20  Surgical pathology  Result Value Ref Range   SURGICAL PATHOLOGY      SURGICAL PATHOLOGY CASE: [id number] PATIENT: [name] Surgical Pathology Report     Specimen Submitted: A. Breast, right  Clinical History: N64.89 radial scar of breast      DIAGNOSIS: A. RIGHT BREAST; EXCISIONAL BIOPSY: - BENIGN BREAST TISSUE WITH:      - PRIOR BIOPSY SITE CHANGES      - FIBROCYSTIC CHANGES WITH APOCRINE METAPLASIA      - FIBROADENOMATOID CHANGES      - PSEUDOANGIOMATOUS STROMAL HYPERPLASIA ([place])      - USUAL DUCTAL HYPERPLASIA - NEGATIVE FOR ATYPIA AND MALIGNANCY.   GROSS DESCRIPTION: A. Labeled: Right breast excisional biopsy Received: Fresh Specimen radiograph image(s) available for review Radiographic findings: An X shaped clip and RF ID tag are present. Time in fixative: Per requisition collected at 10:42 AM on 06/21/2020 and placed in formalin at 10:46 AM on 06/21/2020  Cold ischemic time: Less than 5 minutes Total fixation time: 11 hours  Type of procedure: Excisional breast biopsy Location / laterality of specimen: Ri ght breast Orientation of specimen: The specimen is received inked and is oriented with clear plastic clips labeled as follows: A, M, L, S, and I. Inking: Anterior = green Inferior = blue Lateral = orange Medial = yellow Posterior = black Superior = red Size of specimen: 6.8 (medial to lateral) x 6.4 (superior to inferior) x 2.2 (anterior to posterior) cm Skin: None grossly appreciated.  Biopsy site: There is a hemorrhagic biopsy site with an X shaped clip.  Number of discrete masses: None Description of tissue: At the biopsy site there is a 2.2 x 1.4 x 1.2 cm area of hemorrhage.  The clip is located at the periphery of this area of hemorrhage.  The remainder of the specimen is  comprised of yellow lobulated adipose tissue admixed with tan-white firm fibrous tissue. The fat to fibrous tissue ratio is 70:30.  Block summary (specimen submitted entirely): 1 - 3 - lateral margin, perpendicularly sectioned 4 - 42 - central sections, submitted entirely and sequentiall y from lateral to medial      10 - 37 - area of hemorrhage      21 - clip site 43 - 47 - medial margin, perpendicularly sectioned  RB 06/21/2020  Final Diagnosis performed by Betsy Pries, MD.   Electronically signed 06/22/2020 5:01:18PM The electronic signature indicates that the named Attending Pathologist has evaluated the specimen Technical component performed at [facility], [address], [place], [place] [address] Lab: [phone] Dir: Rush Farmer, MD, MMM  Professional component performed at [facility], [facility], [place], [place], [place] [address] Lab: [phone] Dir: Dellia Nims. Rubinas, MD         Current Outpatient Medications:    cetirizine (ZYRTEC) 10 MG tablet, Take 10 mg by mouth daily., Disp: , Rfl:    Cholecalciferol 50 MCG (2000 UT) TBDP, Take 2,000 Units by mouth daily., Disp: , Rfl:    escitalopram (LEXAPRO) 10 MG tablet, TAKE ONE TABLET BY MOUTH EVERY DAY, Disp: 90 tablet, Rfl: 1   fluticasone (FLONASE) 50 MCG/ACT nasal spray, Place 1 spray into both nostrils 2 (two) times daily as needed., Disp: , Rfl:    ibuprofen (ADVIL) 200 MG tablet, Take 200 mg by mouth every 6 (six) hours as needed., Disp: , Rfl:    Multiple Vitamins-Minerals (MULTIVITAMIN WITH MINERALS) tablet, Take 1 tablet by mouth daily., Disp: , Rfl:    Probiotic Product (PROBIOTIC-10 PO), Take by mouth., Disp: , Rfl:    vitamin B-12 (CYANOCOBALAMIN) 1000 MCG tablet, Take 1,000 mcg by mouth daily., Disp: , Rfl:    fluconazole (DIFLUCAN) 150 MG tablet, Take 1 tablet (150 mg total) by mouth daily for 7 days. (Patient not taking: Reported on 10/11/2020), Disp: 7 tablet, Rfl: 0    Assessment  & Plan:  Rash under breasts / under panty line : improving under the breast per pt and under panty line - better with no more discharge Is scheduled to see derm x end of the month.  Continue diflucan x 2 weeks more.  se calamine and allegra to help with itching.   Vaginal itching :  Will check wet prep and UA today    Pl scheudle wants to switch from [facility] -  Aug 30th please New pt visit to establish care.    Problem List Items Addressed This Visit   None    Orders Placed This Encounter  Procedures   WET PREP FOR [place],  CLUE   Microscopic Examination   CBC with Differential/Platelet   Comprehensive metabolic panel   Lipid panel   TSH   Urinalysis, Routine w reflex microscopic   Bayer DCA Hb A1c Waived (STAT)   Urinalysis, Routine w reflex microscopic     Meds ordered this encounter  Medications   fluconazole (DIFLUCAN) 150 MG tablet    Sig: Take 1 tablet (150 mg total) by mouth daily for 14 days.    Dispense:  14 tablet    Refill:  0   fexofenadine (ALLEGRA ALLERGY) 180 MG tablet    Sig: Take 1 tablet (180 mg total) by mouth daily.    Dispense:  30 tablet    Refill:  1      Follow up plan: No follow-ups on file.

## 2020-10-12 DIAGNOSIS — L304 Erythema intertrigo: Secondary | ICD-10-CM | POA: Diagnosis not present

## 2020-10-12 LAB — CBC WITH DIFFERENTIAL/PLATELET
Basophils Absolute: 0.1 10*3/uL (ref 0.0–0.2)
Basos: 1 %
EOS (ABSOLUTE): 0.4 10*3/uL (ref 0.0–0.4)
Eos: 6 %
Hematocrit: 42 % (ref 34.0–46.6)
Hemoglobin: 14.6 g/dL (ref 11.1–15.9)
Immature Grans (Abs): 0 10*3/uL (ref 0.0–0.1)
Immature Granulocytes: 0 %
Lymphocytes Absolute: 2.1 10*3/uL (ref 0.7–3.1)
Lymphs: 34 %
MCH: 33.5 pg — ABNORMAL HIGH (ref 26.6–33.0)
MCHC: 34.8 g/dL (ref 31.5–35.7)
MCV: 96 fL (ref 79–97)
Monocytes Absolute: 0.6 10*3/uL (ref 0.1–0.9)
Monocytes: 9 %
Neutrophils Absolute: 3.1 10*3/uL (ref 1.4–7.0)
Neutrophils: 50 %
Platelets: 292 10*3/uL (ref 150–450)
RBC: 4.36 x10E6/uL (ref 3.77–5.28)
RDW: 10.9 % — ABNORMAL LOW (ref 11.7–15.4)
WBC: 6.2 10*3/uL (ref 3.4–10.8)

## 2020-10-12 LAB — LIPID PANEL
Chol/HDL Ratio: 2.2 ratio (ref 0.0–4.4)
Cholesterol, Total: 228 mg/dL — ABNORMAL HIGH (ref 100–199)
HDL: 104 mg/dL (ref >39)
LDL Chol Calc (NIH): 114 mg/dL — ABNORMAL HIGH (ref 0–99)
Triglycerides: 57 mg/dL (ref 0–149)
VLDL Cholesterol Cal: 10 mg/dL (ref 5–40)

## 2020-10-12 LAB — COMPREHENSIVE METABOLIC PANEL
ALT: 23 IU/L (ref 0–32)
AST: 27 IU/L (ref 0–40)
Albumin/Globulin Ratio: 2 (ref 1.2–2.2)
Albumin: 4.7 g/dL (ref 3.8–4.9)
Alkaline Phosphatase: 86 IU/L (ref 44–121)
BUN/Creatinine Ratio: 24 — ABNORMAL HIGH (ref 9–23)
BUN: 15 mg/dL (ref 6–24)
Bilirubin Total: 0.6 mg/dL (ref 0.0–1.2)
CO2: 26 mmol/L (ref 20–29)
Calcium: 9.4 mg/dL (ref 8.7–10.2)
Chloride: 99 mmol/L (ref 96–106)
Creatinine, Ser: 0.63 mg/dL (ref 0.57–1.00)
Globulin, Total: 2.4 g/dL (ref 1.5–4.5)
Glucose: 96 mg/dL (ref 65–99)
Potassium: 4 mmol/L (ref 3.5–5.2)
Sodium: 139 mmol/L (ref 134–144)
Total Protein: 7.1 g/dL (ref 6.0–8.5)
eGFR: 104 mL/min/{1.73_m2} (ref >59)

## 2020-10-12 LAB — TSH: TSH: 2.15 u[IU]/mL (ref 0.450–4.500)

## 2020-10-30 ENCOUNTER — Ambulatory Visit: Payer: BC Managed Care – PPO | Admitting: Medical

## 2020-10-30 ENCOUNTER — Other Ambulatory Visit: Payer: Self-pay

## 2020-10-30 VITALS — BP 124/84 | HR 97 | Temp 98.1°F | Resp 16

## 2020-10-30 DIAGNOSIS — L237 Allergic contact dermatitis due to plants, except food: Secondary | ICD-10-CM

## 2020-10-30 MED ORDER — PREDNISONE 10 MG (21) PO TBPK: ORAL_TABLET | ORAL | 0 refills | Status: DC

## 2020-10-30 NOTE — Patient Instructions (Signed)
Contact Dermatitis Dermatitis is redness, soreness, and swelling (inflammation) of the skin. Contact dermatitis is a reaction to something that touches theskin. There are two types of contact dermatitis: Irritant contact dermatitis. This happens when something bothers (irritates) your skin, like soap. Allergic contact dermatitis. This is caused when you are exposed to something that you are allergic to, such as poison ivy. What are the causes? Common causes of irritant contact dermatitis include: Makeup. Soaps. Detergents. Bleaches. Acids. Metals, such as nickel. Common causes of allergic contact dermatitis include: Plants. Chemicals. Jewelry. Latex. Medicines. Preservatives in products, such as clothing. What increases the risk? Having a job that exposes you to things that bother your skin. Having asthma or eczema. What are the signs or symptoms? Symptoms may happen anywhere the irritant has touched your skin. Symptoms include: Dry or flaky skin. Redness. Cracks. Itching. Pain or a burning feeling. Blisters. Blood or clear fluid draining from skin cracks. With allergic contact dermatitis, swelling may occur. This may happen in placessuch as the eyelids, mouth, or genitals. How is this treated? This condition is treated by checking for the cause of the reaction and protecting your skin. Treatment may also include: Steroid creams, ointments, or medicines. Antibiotic medicines or other ointments, if you have a skin infection. Lotion or medicines to help with itching. A bandage (dressing). Follow these instructions at home: Skin care Moisturize your skin as needed. Put cool cloths on your skin. Put a baking soda paste on your skin. Stir water into baking soda until it looks like a paste. Do not scratch your skin. Avoid having things rub up against your skin. Avoid the use of soaps, perfumes, and dyes. Medicines Take or apply over-the-counter and prescription medicines  only as told by your doctor. If you were prescribed an antibiotic medicine, take or apply it as told by your doctor. Do not stop using it even if your condition starts to get better. Bathing Take a bath with: Epsom salts. Baking soda. Colloidal oatmeal. Bathe less often. Bathe in warm water. Avoid using hot water. Bandage care If you were given a bandage, change it as told by your health care provider. Wash your hands with soap and water before and after you change your bandage. If soap and water are not available, use hand sanitizer. General instructions Avoid the things that caused your reaction. If you do not know what caused it, keep a journal. Write down: What you eat. What skin products you use. What you drink. What you wear in the area that has symptoms. This includes jewelry. Check the affected areas every day for signs of infection. Check for: More redness, swelling, or pain. More fluid or blood. Warmth. Pus or a bad smell. Keep all follow-up visits as told by your doctor. This is important. Contact a doctor if: You do not get better with treatment. Your condition gets worse. You have signs of infection, such as: More swelling. Tenderness. More redness. Soreness. Warmth. You have a fever. You have new symptoms. Get help right away if: You have a very bad headache. You have neck pain. Your neck is stiff. You throw up (vomit). You feel very sleepy. You see red streaks coming from the area. Your bone or joint near the area hurts after the skin has healed. The area turns darker. You have trouble breathing. Summary Dermatitis is redness, soreness, and swelling of the skin. Symptoms may occur where the irritant has touched you. Treatment may include medicines and skin care. If you do not  know what caused your reaction, keep a journal. Contact a doctor if your condition gets worse or you have signs of infection. This information is not intended to replace advice  given to you by your health care provider. Make sure you discuss any questions you have with your healthcare provider. Document Revised: 07/08/2018 Document Reviewed: 10/01/2017 Elsevier Patient Education  [place].

## 2020-10-30 NOTE — Progress Notes (Signed)
   Subjective:    Patient ID: Dana Jarvis, female    DOB: [birth year], 55 y.o.   MRN: [id number]  HPI  55 yo female in non acute distress, presents with rash on lower limbs bilateral after working in the yard.the rash started on the following Tuesday, then she had vacation. She has been using Calamine lotion and triamcinolone cream to the areas.  Allergies  Allergen Reactions   Chlorhexidine Gluconate    Latex Rash   Other Rash    Chloraswab   Sulfa Antibiotics Rash     Review of Systems  Skin:  Positive for rash.        vesicles on the right lower thight. Anterior right ankle Posterior left ankle      Objective:   Physical Exam Vitals and nursing note reviewed.  Constitutional:      Appearance: Normal appearance.  HENT:     Head: Normocephalic and atraumatic.  Eyes:     Extraocular Movements: Extraocular movements intact.     Conjunctiva/sclera: Conjunctivae normal.     Pupils: Pupils are equal, round, and reactive to light.  Pulmonary:     Effort: Pulmonary effort is normal.  Musculoskeletal:        General: Normal range of motion.  Skin:    General: Skin is dry.     Findings: Erythema and rash present. Rash is crusting, macular, papular, urticarial and vesicular.       Neurological:     Mental Status: She is alert.    Raised rash,   erythmatious maculopapular rash on anterior right ankle , left posterior ankle and lower right front of thigh.      Assessment & Plan:  Contact dermatitis bilateral ankles and right thigh Meds ordered this encounter  Medications   predniSONE (STERAPRED UNI-PAK 21 TAB) 10 MG (21) TBPK tablet    Sig: Take 6 tablets by mouth today then 5 tablets tomorrow then one less tablet every day there after.    Dispense:  21 tablet    Refill:  0   Luke warm showers, Cool compresses, to sites, OTC Motrin per package instructions x 3 days take both medications with food. Try  1/2 a tablet of Benadryl at night to help with  itching. Continue Calamine Lotion, but stop steroid cream. Patient verbalizes understanding andhas no questions at discharge.

## 2020-11-23 DIAGNOSIS — L304 Erythema intertrigo: Secondary | ICD-10-CM | POA: Diagnosis not present

## 2020-11-28 ENCOUNTER — Ambulatory Visit: Payer: BC Managed Care – PPO | Admitting: Internal Medicine

## 2020-11-30 DIAGNOSIS — H029 Unspecified disorder of eyelid: Secondary | ICD-10-CM | POA: Diagnosis not present

## 2020-12-20 ENCOUNTER — Ambulatory Visit: Payer: BC Managed Care – PPO | Admitting: Internal Medicine

## 2021-02-02 ENCOUNTER — Encounter: Payer: Self-pay | Admitting: Physician Assistant

## 2021-02-06 ENCOUNTER — Ambulatory Visit: Payer: BC Managed Care – PPO | Admitting: Internal Medicine

## 2021-02-12 ENCOUNTER — Encounter: Payer: Self-pay | Admitting: Family Medicine

## 2021-04-30 ENCOUNTER — Ambulatory Visit: Payer: BC Managed Care – PPO | Admitting: Internal Medicine

## 2021-09-20 ENCOUNTER — Encounter: Payer: Self-pay | Admitting: Nurse Practitioner

## 2021-09-20 ENCOUNTER — Ambulatory Visit: Payer: BC Managed Care – PPO | Admitting: Nurse Practitioner

## 2021-09-20 VITALS — BP 131/87 | HR 67 | Temp 98.8°F | Ht 61.5 in | Wt 176.6 lb

## 2021-09-20 DIAGNOSIS — L237 Allergic contact dermatitis due to plants, except food: Secondary | ICD-10-CM | POA: Diagnosis not present

## 2021-09-20 DIAGNOSIS — B356 Tinea cruris: Secondary | ICD-10-CM | POA: Diagnosis not present

## 2021-09-20 DIAGNOSIS — J301 Allergic rhinitis due to pollen: Secondary | ICD-10-CM | POA: Diagnosis not present

## 2021-09-20 DIAGNOSIS — Z803 Family history of malignant neoplasm of breast: Secondary | ICD-10-CM | POA: Diagnosis not present

## 2021-09-20 DIAGNOSIS — Z1231 Encounter for screening mammogram for malignant neoplasm of breast: Secondary | ICD-10-CM

## 2021-09-20 DIAGNOSIS — Z7689 Persons encountering health services in other specified circumstances: Secondary | ICD-10-CM

## 2021-09-20 DIAGNOSIS — E538 Deficiency of other specified B group vitamins: Secondary | ICD-10-CM

## 2021-09-20 DIAGNOSIS — E559 Vitamin D deficiency, unspecified: Secondary | ICD-10-CM

## 2021-09-20 DIAGNOSIS — E7800 Pure hypercholesterolemia, unspecified: Secondary | ICD-10-CM | POA: Insufficient documentation

## 2021-09-20 MED ORDER — FLUCONAZOLE 150 MG PO TABS: ORAL_TABLET | ORAL | 2 refills | Status: DC

## 2021-09-20 MED ORDER — PREDNISONE 10 MG PO TABS: ORAL_TABLET | ORAL | 0 refills | Status: DC

## 2021-09-20 NOTE — Patient Instructions (Signed)
.Please call to schedule your mammogram and/or bone density: [facility]: [address], [place], [place] [address] Phone: [phone]   Rash, Adult  A rash is a change in the color of your skin. A rash can also change the way your skin feels. There are many different conditions and factors that can cause a rash. Follow these instructions at home: The goal of treatment is to stop the itching and keep the rash from spreading. Watch for any changes in your symptoms. Let your doctor know about them. Follow these instructions to help with your condition: Medicine Take or apply over-the-counter and prescription medicines only as told by your doctor. These may include medicines: To treat red or swollen skin (corticosteroid creams). To treat itching. To treat an allergy (oral antihistamines). To treat very bad symptoms (oral corticosteroids).  Skin care Put cool cloths (compresses) on the affected areas. Do not scratch or rub your skin. Avoid covering the rash. Make sure that the rash is exposed to air as much as possible. Managing itching and discomfort Avoid hot showers or baths. These can make itching worse. A cold shower may help. Try taking a bath with: Epsom salts. You can get these at your local pharmacy or grocery store. Follow the instructions on the package. Baking soda. Pour a small amount into the bath as told by your doctor. Colloidal oatmeal. You can get this at your local pharmacy or grocery store. Follow the instructions on the package. Try putting baking soda paste onto your skin. Stir water into baking soda until it gets like a paste. Try putting on a lotion that relieves itchiness (calamine lotion). Keep cool and out of the sun. Sweating and being hot can make itching worse. General instructions  Rest as needed. Drink enough fluid to keep your pee (urine) pale yellow. Wear loose-fitting clothing. Avoid scented  soaps, detergents, and perfumes. Use gentle soaps, detergents, perfumes, and other cosmetic products. Avoid anything that causes your rash. Keep a journal to help track what causes your rash. Write down: What you eat. What cosmetic products you use. What you drink. What you wear. This includes jewelry. Keep all follow-up visits as told by your doctor. This is important. Contact a doctor if: You sweat at night. You lose weight. You pee (urinate) more than normal. You pee less than normal, or you notice that your pee is a darker color than normal. You feel weak. You throw up (vomit). Your skin or the whites of your eyes look yellow (jaundice). Your skin: Tingles. Is numb. Your rash: Does not go away after a few days. Gets worse. You are: More thirsty than normal. More tired than normal. You have: New symptoms. Pain in your belly (abdomen). A fever. Watery poop (diarrhea). Get help right away if: You have a fever and your symptoms suddenly get worse. You start to feel mixed up (confused). You have a very bad headache or a stiff neck. You have very bad joint pains or stiffness. You have jerky movements that you cannot control (seizure). Your rash covers all or most of your body. The rash may or may not be painful. You have blisters that: Are on top of the rash. Grow larger. Grow together. Are painful. Are inside your nose or mouth. You have a rash that: Looks like purple pinprick-sized spots all over your body. Has a "bull's eye" or looks like a target. Is red and painful, causes your skin to peel,  and is not from being in the sun too long. Summary A rash is a change in the color of your skin. A rash can also change the way your skin feels. The goal of treatment is to stop the itching and keep the rash from spreading. Take or apply over-the-counter and prescription medicines only as told by your doctor. Contact a doctor if you have new symptoms or symptoms that get  worse. Keep all follow-up visits as told by your doctor. This is important. This information is not intended to replace advice given to you by your health care provider. Make sure you discuss any questions you have with your health care provider. Document Revised: 12/28/2020 Document Reviewed: 12/28/2020 Elsevier Patient Education  [place].

## 2021-09-20 NOTE — Progress Notes (Signed)
New Patient Office Visit  Subjective    Patient ID: Dana Jarvis, female    DOB: 1965/11/30  Age: 56 y.o. MRN: [id number]  CC:  Chief Complaint  Patient presents with   Establish Care    Patient is here to establish care.    Poison Oak    Patient states it is her annual "working in the yard" time and she was hit with poison oak. Patient first noticed it Saturday. Patient states she noticed a little bit of the rash before Saturday. Patient states she feel it gets worse each time she had a breakout. Patient state she has been using Calamine lotion and Zyrtec every night. Patient is requested a Prednisone prescription at today's visit.     HPI Dana Jarvis presents for new patient visit to establish care.  Introduced to Designer, jewellery role and practice setting.  All questions answered.  Discussed provider/patient relationship and expectations.  Was being seen at [facility] == Dr. Venia Minks and [place] PA.  Significant history of breast cancer among family, maternal and paternal side.  Cousins, aunt, and mother.  ALLERGIES Continues on Zyrtec daily and Flonase PRN.  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 Duration: chronic Runny nose: yes "clear Nasal congestion: yes Nasal itching: no Sneezing: no Eye swelling, itching or discharge: yes Post nasal drip: yes Cough: no Sinus pressure: no  Ear pain: none Ear pressure: none Fever: none Symptoms occur seasonally: yes Symptoms occur perenially: no Satisfied with current treatment: yes Allergist evaluation in past: no Allergen injection immunotherapy: no Recurrent sinus infections: no ENT evaluation in past: no Known environmental allergy: yes -- outside bothers her and cats Indoor pets: no History of asthma: no Current allergy medications: as above Treatments attempted: Zyrtec and Flonase, Claritin   RASH Currently has poison oak, this started on Saturday when working out in yard.  Had a couple  places week before, but these went away.  Present to right forearm, right breast, and left thigh.  Tends to get this yearly.    Has flares of tinea cruris on and off as well -- acts up more on side she sleeps on.   Duration:  days  Location: generalized  Itching: yes Burning: yes Redness: yes Oozing: no Scaling: no Blisters: yes Painful: yes Fevers: no Change in detergents/soaps/personal care products: no Recent illness: no Recent travel:no History of same: yes Context: fluctuating Alleviating factors: in past Prednisone clears this up Treatments attempted: Calamine at home Shortness of breath: no  Throat/tongue swelling: no Myalgias/arthralgias: no   Outpatient Encounter Medications as of 09/20/2021  Medication Sig   cetirizine (ZYRTEC) 10 MG tablet Take 10 mg by mouth daily.   Cholecalciferol 50 MCG (2000 UT) TBDP Take 2,000 Units by mouth daily.   fluconazole (DIFLUCAN) 150 MG tablet Take one tablet (150 MG) by mouth once a week as needed for skin rash if present.   fluticasone (FLONASE) 50 MCG/ACT nasal spray Place 1 spray into both nostrils 2 (two) times daily as needed.   ibuprofen (ADVIL) 200 MG tablet Take 200 mg by mouth every 6 (six) hours as needed.   Multiple Vitamins-Minerals (MULTIVITAMIN WITH MINERALS) tablet Take 1 tablet by mouth daily.   predniSONE (DELTASONE) 10 MG tablet Take 6 tablets by mouth daily for 2 days, then reduce by 1 tablet every 2 days until gone   vitamin B-12 (CYANOCOBALAMIN) 1000 MCG tablet Take 1,000 mcg by mouth daily.   [DISCONTINUED] fexofenadine (ALLEGRA ALLERGY)  180 MG tablet Take 1 tablet (180 mg total) by mouth daily.   [DISCONTINUED] escitalopram (LEXAPRO) 10 MG tablet TAKE ONE TABLET BY MOUTH EVERY DAY   [DISCONTINUED] predniSONE (STERAPRED UNI-PAK 21 TAB) 10 MG (21) TBPK tablet Take 6 tablets by mouth today then 5 tablets tomorrow then one less tablet every day there after. (Patient not taking: Reported on 09/20/2021)   [DISCONTINUED]  Probiotic Product (PROBIOTIC-10 PO) Take by mouth.   No facility-administered encounter medications on file as of 09/20/2021.    Past Medical History:  Diagnosis Date   Arthritis    hands, feet   Complication of anesthesia    has awakened during procedures   Headache    sinus   Motion sickness    ocean ships   Seasonal allergies    Wears contact lenses     Past Surgical History:  Procedure Laterality Date   ABDOMINAL HYSTERECTOMY  2005   with bilateral salpingo-oophorectomy   BREAST BIOPSY Right 05/19/2020   Stereo Bx, X-clip, path pending    BREAST BIOPSY WITH RADIO FREQUENCY LOCALIZER Right 06/21/2020   Procedure: EXCISION OF BREAST BIOPSY with Radiofrequency Tag;  Surgeon: Herbert Pun, MD;  Location: [facility] ORS;  Service: General;  Laterality: Right;   CESAREAN SECTION  1988   COLONOSCOPY WITH PROPOFOL N/A 12/31/2016   Procedure: COLONOSCOPY WITH PROPOFOL;  Surgeon: [name], MD;  Location: [facility] ENDOSCOPY;  Service: Endoscopy;  Laterality: N/A;   FRACTURE SURGERY Right 2007   ankle   HARDWARE REMOVAL Right    Ankle   TONSILLECTOMY     WRIST SURGERY Left    ganglion    Family History  Problem Relation Age of Onset   Arthritis Mother    Cancer Mother        breast   Glaucoma Mother    Breast cancer Mother 13   Cancer Father        lung   Hypertension Sister    Hypertension Brother    Breast cancer Maternal Aunt    Breast cancer Cousin    Breast cancer Cousin    Colon cancer Neg Hx    Ovarian cancer Neg Hx     Social History   Socioeconomic History   Marital status: Widowed    Spouse name: Not on file   Number of children: 1   Years of education: Not on file   Highest education level: Not on file  Occupational History   Not on file  Tobacco Use   Smoking status: Never   Smokeless tobacco: Never  Vaping Use   Vaping Use: Never used  Substance and Sexual Activity   Alcohol use: Yes    Alcohol/week: 4.0 standard drinks of alcohol     Types: 4 Glasses of wine per week    Comment: occasional   Drug use: No   Sexual activity: Yes  Other Topics Concern   Not on file  Social History Narrative   Not on file   Social Determinants of Health   Financial Resource Strain: Low Risk  (09/20/2021)   Overall Financial Resource Strain (CARDIA)    Difficulty of Paying Living Expenses: Not hard at all  Food Insecurity: No Food Insecurity (09/20/2021)   Hunger Vital Sign    Worried About Running Out of Food in the Last Year: Never true    Ran Out of Food in the Last Year: Never true  Transportation Needs: No Transportation Needs (09/20/2021)   PRAPARE - Transportation    Lack of  Transportation (Medical): No    Lack of Transportation (Non-Medical): No  Physical Activity: Sufficiently Active (09/20/2021)   Exercise Vital Sign    Days of Exercise per Week: 5 days    Minutes of Exercise per Session: 30 min  Stress: No Stress Concern Present (09/20/2021)   [place]    Feeling of Stress : Only a little  Social Connections: Moderately Isolated (09/20/2021)   Social Connection and Isolation Panel [NHANES]    Frequency of Communication with Friends and Family: More than three times a week    Frequency of Social Gatherings with Friends and Family: More than three times a week    Attends Religious Services: Never    Marine scientist or Organizations: No    Attends Archivist Meetings: Never    Marital Status: Living with partner  Intimate Partner Violence: Not At Risk (09/20/2021)   Humiliation, Afraid, Rape, and Kick questionnaire    Fear of Current or Ex-Partner: No    Emotionally Abused: No    Physically Abused: No    Sexually Abused: No    Review of Systems  Constitutional:  Negative for chills, diaphoresis, fever and weight loss.  Respiratory:  Negative for cough, shortness of breath and wheezing.   Cardiovascular:  Negative for chest pain,  palpitations, orthopnea and leg swelling.  Skin:  Positive for rash.  Neurological: Negative.   Psychiatric/Behavioral: Negative.          Objective    BP 131/87   Pulse 67   Temp 98.8 F (37.1 C) (Oral)   Ht 5' 1.5" (1.562 m)   Wt 176 lb 9.6 oz (80.1 kg)   LMP  (LMP Unknown)   SpO2 95%   BMI 32.83 kg/m   Physical Exam Vitals and nursing note reviewed.  Constitutional:      General: She is awake. She is not in acute distress.    Appearance: She is well-developed and well-groomed. She is obese. She is not ill-appearing or toxic-appearing.  HENT:     Head: Normocephalic.     Right Ear: Hearing normal.     Left Ear: Hearing normal.  Eyes:     General: Lids are normal.        Right eye: No discharge.        Left eye: No discharge.     Conjunctiva/sclera: Conjunctivae normal.     Pupils: Pupils are equal, round, and reactive to light.  Neck:     Thyroid: No thyromegaly.     Vascular: No carotid bruit.  Cardiovascular:     Rate and Rhythm: Normal rate and regular rhythm.     Heart sounds: Normal heart sounds. No murmur heard.    No gallop.  Pulmonary:     Effort: Pulmonary effort is normal. No accessory muscle usage or respiratory distress.     Breath sounds: Normal breath sounds.  Abdominal:     General: Bowel sounds are normal.     Palpations: Abdomen is soft.  Musculoskeletal:     Cervical back: Normal range of motion and neck supple.     Right lower leg: No edema.     Left lower leg: No edema.  Lymphadenopathy:     Cervical: No cervical adenopathy.  Skin:    General: Skin is warm and dry.     Findings: Rash present.       Neurological:     Mental Status: She is alert and oriented  to person, place, and time.  Psychiatric:        Attention and Perception: Attention normal.        Mood and Affect: Mood normal.        Speech: Speech normal.        Behavior: Behavior normal. Behavior is cooperative.        Thought Content: Thought content normal.     Last CBC Lab Results  Component Value Date   WBC 6.2 10/11/2020   HGB 14.6 10/11/2020   HCT 42.0 10/11/2020   MCV 96 10/11/2020   MCH 33.5 (H) 10/11/2020   RDW 10.9 (L) 10/11/2020   PLT 292 41/58/3094   Last metabolic panel Lab Results  Component Value Date   GLUCOSE 96 10/11/2020   NA 139 10/11/2020   K 4.0 10/11/2020   CL 99 10/11/2020   CO2 26 10/11/2020   BUN 15 10/11/2020   CREATININE 0.63 10/11/2020   EGFR 104 10/11/2020   CALCIUM 9.4 10/11/2020   PROT 7.1 10/11/2020   ALBUMIN 4.7 10/11/2020   LABGLOB 2.4 10/11/2020   AGRATIO 2.0 10/11/2020   BILITOT 0.6 10/11/2020   ALKPHOS 86 10/11/2020   AST 27 10/11/2020   ALT 23 10/11/2020   Last lipids Lab Results  Component Value Date   CHOL 228 (H) 10/11/2020   HDL 104 10/11/2020   LDLCALC 114 (H) 10/11/2020   TRIG 57 10/11/2020   CHOLHDL 2.2 10/11/2020   Last hemoglobin A1c Lab Results  Component Value Date   HGBA1C 4.9 10/11/2020   Last thyroid functions Lab Results  Component Value Date   TSH 2.150 10/11/2020   Last vitamin D No results found for: "25OHVITD2", "25OHVITD3", "VD25OH" Last vitamin B12 and Folate No results found for: "VITAMINB12", "FOLATE"      Assessment & Plan:   Problem List Items Addressed This Visit       Respiratory   Allergic rhinitis    Chronic, ongoing.  Continue Zyrtec and Flonase at this time.  May trial switch to Allegra.  May benefit from allergy testing in future.        Musculoskeletal and Integument   Allergic contact dermatitis due to plants, except food - Primary    Acute, recurrent annually, poison oak.  Will treat at this time with Prednisone taper, which has worked well for her in past.  Recommend to avoid touching areas and wash all clothing and bedding well.  Wear gloves and longer clothes when out gardening.  Return for worsening or ongoing symptoms.      Relevant Orders   TSH   CBC with Differential/Platelet   Tinea cruris    Occasional  outbreaks under breasts and folds of skin.  Will send in Diflucan to use as needed once a week if flare presents, has Nystatin powder but does not always offer benefit.  Recommend ensuring skin is dry after showering, pool time, or working outside.      Relevant Medications   fluconazole (DIFLUCAN) 150 MG tablet     Other   Elevated LDL cholesterol level    Noted on past labs, no current medications.  Plan to recheck lipid panel at physical and start medication as needed.      Relevant Orders   Lipid Panel w/o Chol/HDL Ratio   Comprehensive metabolic panel   Family history of breast cancer    Both on maternal and paternal sides -- cousins, aunt, and mother.  Recommend annual mammogram and discussed referral to oncology for genetic  testing, she wishes to think about this.      Other Visit Diagnoses     Vitamin D deficiency       History of low levels, taking supplement, check on annual labs.   Relevant Orders   VITAMIN D 25 Hydroxy (Vit-D Deficiency, Fractures)   Vitamin B12 deficiency       History of low levels, taking supplement, check on annual labs.   Relevant Orders   Vitamin B12   Encounter for screening mammogram for malignant neoplasm of breast       Mammogram ordered.   Relevant Orders   MM 3D SCREEN BREAST BILATERAL   Encounter to establish care       New patient to clinic, introduced to practice setting and provider.       Return in about 27 days (around 10/17/2021) for Annual physical -- plus labs a day or two before.   Venita Lick, NP

## 2021-09-20 NOTE — Assessment & Plan Note (Signed)
Occasional outbreaks under breasts and folds of skin.  Will send in Diflucan to use as needed once a week if flare presents, has Nystatin powder but does not always offer benefit.  Recommend ensuring skin is dry after showering, pool time, or working outside.

## 2021-09-20 NOTE — Assessment & Plan Note (Signed)
Chronic, ongoing.  Continue Zyrtec and Flonase at this time.  May trial switch to Allegra.  May benefit from allergy testing in future.

## 2021-09-20 NOTE — Assessment & Plan Note (Signed)
Noted on past labs, no current medications.  Plan to recheck lipid panel at physical and start medication as needed.

## 2021-09-20 NOTE — Assessment & Plan Note (Signed)
Both on maternal and paternal sides -- cousins, aunt, and mother.  Recommend annual mammogram and discussed referral to oncology for genetic testing, she wishes to think about this.

## 2021-09-20 NOTE — Assessment & Plan Note (Signed)
Acute, recurrent annually, poison oak.  Will treat at this time with Prednisone taper, which has worked well for her in past.  Recommend to avoid touching areas and wash all clothing and bedding well.  Wear gloves and longer clothes when out gardening.  Return for worsening or ongoing symptoms.

## 2021-10-13 NOTE — Patient Instructions (Signed)

## 2021-10-15 ENCOUNTER — Other Ambulatory Visit: Payer: BC Managed Care – PPO

## 2021-10-17 ENCOUNTER — Ambulatory Visit (INDEPENDENT_AMBULATORY_CARE_PROVIDER_SITE_OTHER): Payer: BC Managed Care – PPO | Admitting: Nurse Practitioner

## 2021-10-17 ENCOUNTER — Encounter: Payer: Self-pay | Admitting: Nurse Practitioner

## 2021-10-17 VITALS — BP 125/76 | HR 76 | Temp 98.3°F | Ht 61.25 in | Wt 172.6 lb

## 2021-10-17 DIAGNOSIS — Z803 Family history of malignant neoplasm of breast: Secondary | ICD-10-CM

## 2021-10-17 DIAGNOSIS — J301 Allergic rhinitis due to pollen: Secondary | ICD-10-CM | POA: Diagnosis not present

## 2021-10-17 DIAGNOSIS — E559 Vitamin D deficiency, unspecified: Secondary | ICD-10-CM

## 2021-10-17 DIAGNOSIS — E7800 Pure hypercholesterolemia, unspecified: Secondary | ICD-10-CM

## 2021-10-17 DIAGNOSIS — Z789 Other specified health status: Secondary | ICD-10-CM

## 2021-10-17 DIAGNOSIS — Z0000 Encounter for general adult medical examination without abnormal findings: Secondary | ICD-10-CM | POA: Diagnosis not present

## 2021-10-17 NOTE — Assessment & Plan Note (Signed)
Recommend she call to schedule her mammogram.

## 2021-10-17 NOTE — Progress Notes (Deleted)
BP 125/76   Pulse 76   Temp 98.3 F (36.8 C) (Oral)   Ht 5' 1.25" (1.556 m)   Wt 172 lb 9.6 oz (78.3 kg)   LMP  (LMP Unknown)   SpO2 96%   BMI 32.35 kg/m    Subjective:    Patient ID: Dana Jarvis, female    DOB: 1965-12-11, 56 y.o.   MRN: [id number]  HPI: Dana Jarvis is a 56 y.o. female presenting on 10/17/2021 for comprehensive medical examination. Current medical complaints include:{Blank single:19197::"none","***"}  She currently lives with: Menopausal Symptoms: {Blank single:19197::"yes","no"}  Depression Screen done today and results listed below:     09/20/2021    8:42 AM 10/11/2020    8:17 AM 10/04/2020   10:03 AM 01/31/2020    1:23 PM 01/18/2019    9:06 AM  Depression screen PHQ 2/9  Decreased Interest 0 0 0 0 0  Down, Depressed, Hopeless 0 0 0 0 0  PHQ - 2 Score 0 0 0 0 0  Altered sleeping 0   0   Tired, decreased energy 0   0   Change in appetite 0   0   Feeling bad or failure about yourself  0   0   Trouble concentrating 0   0   Moving slowly or fidgety/restless 0   0   Suicidal thoughts 0   0   PHQ-9 Score 0   0   Difficult doing work/chores Not difficult at all   Not difficult at all     The patient {has/does not have:19849} a history of falls. I {did/did not:19850} complete a risk assessment for falls. A plan of care for falls {was/was not:19852} documented.   Past Medical History:  Past Medical History:  Diagnosis Date   Arthritis    hands, feet   Complication of anesthesia    has awakened during procedures   Headache    sinus   Motion sickness    ocean ships   Seasonal allergies    Wears contact lenses     Surgical History:  Past Surgical History:  Procedure Laterality Date   ABDOMINAL HYSTERECTOMY  2005   with bilateral salpingo-oophorectomy   BREAST BIOPSY Right 05/19/2020   Stereo Bx, X-clip, path pending    BREAST BIOPSY WITH RADIO FREQUENCY LOCALIZER Right 06/21/2020   Procedure: EXCISION OF BREAST BIOPSY with Radiofrequency  Tag;  Surgeon: Herbert Pun, MD;  Location: [facility];  Service: General;  Laterality: Right;   CESAREAN SECTION  1988   COLONOSCOPY WITH PROPOFOL N/A 12/31/2016   Procedure: COLONOSCOPY WITH PROPOFOL;  Surgeon: [name], MD;  Location: [facility];  Service: Endoscopy;  Laterality: N/A;   FRACTURE SURGERY Right 2007   ankle   HARDWARE REMOVAL Right    Ankle   TONSILLECTOMY     WRIST SURGERY Left    ganglion    Medications:  Current Outpatient Medications on File Prior to Visit  Medication Sig   Bacillus Coagulans-Inulin (PROBIOTIC-PREBIOTIC) 1-250 BILLION-MG CAPS Take by mouth.   cetirizine (ZYRTEC) 10 MG tablet Take 10 mg by mouth daily.   Cholecalciferol 50 MCG (2000 UT) TBDP Take 2,000 Units by mouth daily.   fluconazole (DIFLUCAN) 150 MG tablet Take one tablet (150 MG) by mouth once a week as needed for skin rash if present.   fluticasone (FLONASE) 50 MCG/ACT nasal spray Place 1 spray into both nostrils 2 (two) times daily as needed.   ibuprofen (ADVIL) 200 MG tablet Take 200 mg  by mouth every 6 (six) hours as needed.   Multiple Vitamins-Minerals (MULTIVITAMIN WITH MINERALS) tablet Take 1 tablet by mouth daily.   vitamin B-12 (CYANOCOBALAMIN) 1000 MCG tablet Take 1,000 mcg by mouth daily.   predniSONE (DELTASONE) 10 MG tablet Take 6 tablets by mouth daily for 2 days, then reduce by 1 tablet every 2 days until gone (Patient not taking: Reported on 10/17/2021)   No current facility-administered medications on file prior to visit.    Allergies:  Allergies  Allergen Reactions   Chlorhexidine Gluconate    Latex Rash   Other Rash    Chloraswab   Sulfa Antibiotics Rash    Social History:  Social History   Socioeconomic History   Marital status: Widowed    Spouse name: Not on file   Number of children: 1   Years of education: Not on file   Highest education level: Not on file  Occupational History   Not on file  Tobacco Use   Smoking status: Never    Smokeless tobacco: Never  Vaping Use   Vaping Use: Never used  Substance and Sexual Activity   Alcohol use: Yes    Alcohol/week: 4.0 standard drinks of alcohol    Types: 4 Glasses of wine per week    Comment: occasional   Drug use: No   Sexual activity: Yes  Other Topics Concern   Not on file  Social History Narrative   Not on file   Social Determinants of Health   Financial Resource Strain: Low Risk  (09/20/2021)   Overall Financial Resource Strain (CARDIA)    Difficulty of Paying Living Expenses: Not hard at all  Food Insecurity: No Food Insecurity (09/20/2021)   Hunger Vital Sign    Worried About Running Out of Food in the Last Year: Never true    Ran Out of Food in the Last Year: Never true  Transportation Needs: No Transportation Needs (09/20/2021)   PRAPARE - Hydrologist (Medical): No    Lack of Transportation (Non-Medical): No  Physical Activity: Sufficiently Active (09/20/2021)   Exercise Vital Sign    Days of Exercise per Week: 5 days    Minutes of Exercise per Session: 30 min  Stress: No Stress Concern Present (09/20/2021)   [place]    Feeling of Stress : Only a little  Social Connections: Moderately Isolated (09/20/2021)   Social Connection and Isolation Panel [NHANES]    Frequency of Communication with Friends and Family: More than three times a week    Frequency of Social Gatherings with Friends and Family: More than three times a week    Attends Religious Services: Never    Marine scientist or Organizations: No    Attends Archivist Meetings: Never    Marital Status: Living with partner  Intimate Partner Violence: Not At Risk (09/20/2021)   Humiliation, Afraid, Rape, and Kick questionnaire    Fear of Current or Ex-Partner: No    Emotionally Abused: No    Physically Abused: No    Sexually Abused: No   Social History   Tobacco Use  Smoking Status  Never  Smokeless Tobacco Never   Social History   Substance and Sexual Activity  Alcohol Use Yes   Alcohol/week: 4.0 standard drinks of alcohol   Types: 4 Glasses of wine per week   Comment: occasional    Family History:  Family History  Problem Relation Age of  Onset   Arthritis Mother    Cancer Mother        breast   Glaucoma Mother    Breast cancer Mother 35   Cancer Father        lung   Hypertension Sister    Hypertension Brother    Breast cancer Maternal Aunt    Breast cancer Cousin    Breast cancer Cousin    Colon cancer Neg Hx    Ovarian cancer Neg Hx     Past medical history, surgical history, medications, allergies, family history and social history reviewed with patient today and changes made to appropriate areas of the chart.   ROS All other ROS negative except what is listed above and in the HPI.      Objective:    BP 125/76   Pulse 76   Temp 98.3 F (36.8 C) (Oral)   Ht 5' 1.25" (1.556 m)   Wt 172 lb 9.6 oz (78.3 kg)   LMP  (LMP Unknown)   SpO2 96%   BMI 32.35 kg/m   Wt Readings from Last 3 Encounters:  10/17/21 172 lb 9.6 oz (78.3 kg)  09/20/21 176 lb 9.6 oz (80.1 kg)  10/11/20 161 lb 6.4 oz (73.2 kg)    Physical Exam  Results for orders placed or performed in visit on 10/11/20  WET PREP FOR [place], YEAST, CLUE   Specimen: Sterile Swab   Sterile Swab  Result Value Ref Range   Trichomonas Exam Negative Negative   Yeast Exam Negative Negative   Clue Cell Exam Negative Negative  Microscopic Examination   Urine  Result Value Ref Range   WBC, UA 0-5 0 - 5 /hpf   RBC, Urine 0-2 0 - 2 /hpf   Epithelial Cells (non renal) 0-10 0 - 10 /hpf   Bacteria, UA None seen None seen/Few  Bayer DCA Hb A1c Waived (STAT)  Result Value Ref Range   HB A1C (BAYER DCA - WAIVED) 4.9 <7.0 %  Urinalysis, Routine w reflex microscopic  Result Value Ref Range   Specific Gravity, UA 1.010 1.005 - 1.030   pH, UA 7.0 5.0 - 7.5   Color, UA Yellow Yellow    Appearance Ur Clear Clear   Leukocytes,UA Trace (A) Negative   Protein,UA Negative Negative/Trace   Glucose, UA Negative Negative   Ketones, UA Negative Negative   RBC, UA Trace (A) Negative   Bilirubin, UA Negative Negative   Urobilinogen, Ur 0.2 0.2 - 1.0 mg/dL   Nitrite, UA Negative Negative   Microscopic Examination See below:   TSH  Result Value Ref Range   TSH 2.150 0.450 - 4.500 uIU/mL  Lipid panel  Result Value Ref Range   Cholesterol, Total 228 (H) 100 - 199 mg/dL   Triglycerides 57 0 - 149 mg/dL   HDL 104 >39 mg/dL   VLDL Cholesterol Cal 10 5 - 40 mg/dL   LDL Chol Calc (NIH) 114 (H) 0 - 99 mg/dL   Chol/HDL Ratio 2.2 0.0 - 4.4 ratio  Comprehensive metabolic panel  Result Value Ref Range   Glucose 96 65 - 99 mg/dL   BUN 15 6 - 24 mg/dL   Creatinine, Ser 0.63 0.57 - 1.00 mg/dL   eGFR 104 >59 mL/min/1.73   BUN/Creatinine Ratio 24 (H) 9 - 23   Sodium 139 134 - 144 mmol/L   Potassium 4.0 3.5 - 5.2 mmol/L   Chloride 99 96 - 106 mmol/L   CO2 26 20 - 29 mmol/L  Calcium 9.4 8.7 - 10.2 mg/dL   Total Protein 7.1 6.0 - 8.5 g/dL   Albumin 4.7 3.8 - 4.9 g/dL   Globulin, Total 2.4 1.5 - 4.5 g/dL   Albumin/Globulin Ratio 2.0 1.2 - 2.2   Bilirubin Total 0.6 0.0 - 1.2 mg/dL   Alkaline Phosphatase 86 44 - 121 IU/L   AST 27 0 - 40 IU/L   ALT 23 0 - 32 IU/L  CBC with Differential/Platelet  Result Value Ref Range   WBC 6.2 3.4 - 10.8 x10E3/uL   RBC 4.36 3.77 - 5.28 x10E6/uL   Hemoglobin 14.6 11.1 - 15.9 g/dL   Hematocrit 42.0 34.0 - 46.6 %   MCV 96 79 - 97 fL   MCH 33.5 (H) 26.6 - 33.0 pg   MCHC 34.8 31.5 - 35.7 g/dL   RDW 10.9 (L) 11.7 - 15.4 %   Platelets 292 150 - 450 x10E3/uL   Neutrophils 50 Not Estab. %   Lymphs 34 Not Estab. %   Monocytes 9 Not Estab. %   Eos 6 Not Estab. %   Basos 1 Not Estab. %   Neutrophils Absolute 3.1 1.4 - 7.0 x10E3/uL   Lymphocytes Absolute 2.1 0.7 - 3.1 x10E3/uL   Monocytes Absolute 0.6 0.1 - 0.9 x10E3/uL   EOS (ABSOLUTE) 0.4 0.0 - 0.4  x10E3/uL   Basophils Absolute 0.1 0.0 - 0.2 x10E3/uL   Immature Granulocytes 0 Not Estab. %   Immature Grans (Abs) 0.0 0.0 - 0.1 x10E3/uL      Assessment & Plan:   Problem List Items Addressed This Visit       Respiratory   Allergic rhinitis   Relevant Orders   CBC with Differential/Platelet     Other   Elevated LDL cholesterol level   Relevant Orders   Comprehensive metabolic panel   Lipid Panel w/o Chol/HDL Ratio   Other Visit Diagnoses     Encounter for annual physical exam    -  Primary   Annual physical today with labs and health maintenance reviewed, discussed with patient.   Relevant Orders   TSH   Vitamin D deficiency       History of low levels, continue supplement and recheck today.   Relevant Orders   VITAMIN D 25 Hydroxy (Vit-D Deficiency, Fractures)        Follow up plan: No follow-ups on file.   LABORATORY TESTING:  - Pap smear: {Blank YQMVHQ:46962::"XBM done","not applicable","up to date","done elsewhere"}  IMMUNIZATIONS:   - Tdap: Tetanus vaccination status reviewed: {tetanus status:315746}. - Influenza: {Blank single:19197::"Up to date","Administered today","Postponed to flu season","Refused","Given elsewhere"} - Pneumovax: {Blank single:19197::"Up to date","Administered today","Not applicable","Refused","Given elsewhere"} - Prevnar: {Blank single:19197::"Up to date","Administered today","Not applicable","Refused","Given elsewhere"} - COVID: {Blank single:19197::"Up to date","Administered today","Not applicable","Refused","Given elsewhere"} - HPV: {Blank single:19197::"Up to date","Administered today","Not applicable","Refused","Given elsewhere"} - Shingrix vaccine: {Blank single:19197::"Up to date","Administered today","Not applicable","Refused","Given elsewhere"}  SCREENING: -Mammogram: {Blank single:19197::"Up to date","Ordered today","Not applicable","Refused","Done elsewhere"}  - Colonoscopy: {Blank single:19197::"Up to date","Ordered  today","Not applicable","Refused","Done elsewhere"}  - Bone Density: {Blank single:19197::"Up to date","Ordered today","Not applicable","Refused","Done elsewhere"}  -Hearing Test: {Blank single:19197::"Up to date","Ordered today","Not applicable","Refused","Done elsewhere"}  -Spirometry: {Blank single:19197::"Up to date","Ordered today","Not applicable","Refused","Done elsewhere"}   PATIENT COUNSELING:   Advised to take 1 mg of folate supplement per day if capable of pregnancy.   Sexuality: Discussed sexually transmitted diseases, partner selection, use of condoms, avoidance of unintended pregnancy  and contraceptive alternatives.   Advised to avoid cigarette smoking.  I discussed with the patient that most people either abstain  from alcohol or drink within safe limits (<=14/week and <=4 drinks/occasion for males, <=7/weeks and <= 3 drinks/occasion for females) and that the risk for alcohol disorders and other health effects rises proportionally with the number of drinks per week and how often a drinker exceeds daily limits.  Discussed cessation/primary prevention of drug use and availability of treatment for abuse.   Diet: Encouraged to adjust caloric intake to maintain  or achieve ideal body weight, to reduce intake of dietary saturated fat and total fat, to limit sodium intake by avoiding high sodium foods and not adding table salt, and to maintain adequate dietary potassium and calcium preferably from fresh fruits, vegetables, and low-fat dairy products.    Stressed the importance of regular exercise  Injury prevention: Discussed safety belts, safety helmets, smoke detector, smoking near bedding or upholstery.   Dental health: Discussed importance of regular tooth brushing, flossing, and dental visits.    NEXT PREVENTATIVE PHYSICAL DUE IN 1 YEAR. No follow-ups on file.

## 2021-10-17 NOTE — Progress Notes (Signed)
BP 125/76   Pulse 76   Temp 98.3 F (36.8 C) (Oral)   Ht 5' 1.25" (1.556 m)   Wt 172 lb 9.6 oz (78.3 kg)   LMP  (LMP Unknown)   SpO2 96%   BMI 32.35 kg/m    Subjective:    Patient ID: Dana Jarvis, female    DOB: 1966/02/23, 56 y.o.   MRN: [id number]  NOTE WRITTEN BY FNP STUDENT.  ASSESSMENT AND PLAN OF CARE REVIEWED WITH STUDENT, AGREE WITH ABOVE FINDINGS AND PLAN.   HPI: Dana Jarvis is a 56 y.o. female presenting on 10/17/2021 for comprehensive medical examination. Current medical complaints include:none  She currently lives with: Self  Menopausal Symptoms: no  Depression Screen done today and results listed below:     09/20/2021    8:42 AM 10/11/2020    8:17 AM 10/04/2020   10:03 AM 01/31/2020    1:23 PM 01/18/2019    9:06 AM  Depression screen PHQ 2/9  Decreased Interest 0 0 0 0 0  Down, Depressed, Hopeless 0 0 0 0 0  PHQ - 2 Score 0 0 0 0 0  Altered sleeping 0   0   Tired, decreased energy 0   0   Change in appetite 0   0   Feeling bad or failure about yourself  0   0   Trouble concentrating 0   0   Moving slowly or fidgety/restless 0   0   Suicidal thoughts 0   0   PHQ-9 Score 0   0   Difficult doing work/chores Not difficult at all   Not difficult at all     The patient does not have a history of falls. I did not complete a risk assessment for falls. A plan of care for falls was not documented.   Past Medical History:  Past Medical History:  Diagnosis Date   Arthritis    hands, feet   Complication of anesthesia    has awakened during procedures   Headache    sinus   Motion sickness    ocean ships   Seasonal allergies    Wears contact lenses     Surgical History:  Past Surgical History:  Procedure Laterality Date   ABDOMINAL HYSTERECTOMY  2005   with bilateral salpingo-oophorectomy   BREAST BIOPSY Right 05/19/2020   Stereo Bx, X-clip, path pending    BREAST BIOPSY WITH RADIO FREQUENCY LOCALIZER Right 06/21/2020   Procedure: EXCISION OF  BREAST BIOPSY with Radiofrequency Tag;  Surgeon: Herbert Pun, MD;  Location: [facility] ORS;  Service: General;  Laterality: Right;   CESAREAN SECTION  1988   COLONOSCOPY WITH PROPOFOL N/A 12/31/2016   Procedure: COLONOSCOPY WITH PROPOFOL;  Surgeon: [name], MD;  Location: [facility];  Service: Endoscopy;  Laterality: N/A;   FRACTURE SURGERY Right 2007   ankle   HARDWARE REMOVAL Right    Ankle   TONSILLECTOMY     WRIST SURGERY Left    ganglion    Medications:  Current Outpatient Medications on File Prior to Visit  Medication Sig   Bacillus Coagulans-Inulin (PROBIOTIC-PREBIOTIC) 1-250 BILLION-MG CAPS Take by mouth.   cetirizine (ZYRTEC) 10 MG tablet Take 10 mg by mouth daily.   Cholecalciferol 50 MCG (2000 UT) TBDP Take 2,000 Units by mouth daily.   fluconazole (DIFLUCAN) 150 MG tablet Take one tablet (150 MG) by mouth once a week as needed for skin rash if present.   fluticasone (FLONASE) 50 MCG/ACT nasal spray  Place 1 spray into both nostrils 2 (two) times daily as needed.   ibuprofen (ADVIL) 200 MG tablet Take 200 mg by mouth every 6 (six) hours as needed.   Multiple Vitamins-Minerals (MULTIVITAMIN WITH MINERALS) tablet Take 1 tablet by mouth daily.   vitamin B-12 (CYANOCOBALAMIN) 1000 MCG tablet Take 1,000 mcg by mouth daily.   predniSONE (DELTASONE) 10 MG tablet Take 6 tablets by mouth daily for 2 days, then reduce by 1 tablet every 2 days until gone (Patient not taking: Reported on 10/17/2021)   No current facility-administered medications on file prior to visit.    Allergies:  Allergies  Allergen Reactions   Chlorhexidine Gluconate    Latex Rash   Other Rash    Chloraswab   Sulfa Antibiotics Rash    Social History:  Social History   Socioeconomic History   Marital status: Widowed    Spouse name: Not on file   Number of children: 1   Years of education: Not on file   Highest education level: Not on file  Occupational History   Not on file  Tobacco Use    Smoking status: Never   Smokeless tobacco: Never  Vaping Use   Vaping Use: Never used  Substance and Sexual Activity   Alcohol use: Yes    Alcohol/week: 4.0 standard drinks of alcohol    Types: 4 Glasses of wine per week    Comment: occasional   Drug use: No   Sexual activity: Yes  Other Topics Concern   Not on file  Social History Narrative   Not on file   Social Determinants of Health   Financial Resource Strain: Low Risk  (09/20/2021)   Overall Financial Resource Strain (CARDIA)    Difficulty of Paying Living Expenses: Not hard at all  Food Insecurity: No Food Insecurity (09/20/2021)   Hunger Vital Sign    Worried About Running Out of Food in the Last Year: Never true    Ran Out of Food in the Last Year: Never true  Transportation Needs: No Transportation Needs (09/20/2021)   PRAPARE - Hydrologist (Medical): No    Lack of Transportation (Non-Medical): No  Physical Activity: Sufficiently Active (09/20/2021)   Exercise Vital Sign    Days of Exercise per Week: 5 days    Minutes of Exercise per Session: 30 min  Stress: No Stress Concern Present (09/20/2021)   [place]    Feeling of Stress : Only a little  Social Connections: Moderately Isolated (09/20/2021)   Social Connection and Isolation Panel [NHANES]    Frequency of Communication with Friends and Family: More than three times a week    Frequency of Social Gatherings with Friends and Family: More than three times a week    Attends Religious Services: Never    Marine scientist or Organizations: No    Attends Archivist Meetings: Never    Marital Status: Living with partner  Intimate Partner Violence: Not At Risk (09/20/2021)   Humiliation, Afraid, Rape, and Kick questionnaire    Fear of Current or Ex-Partner: No    Emotionally Abused: No    Physically Abused: No    Sexually Abused: No   Social History    Tobacco Use  Smoking Status Never  Smokeless Tobacco Never   Social History   Substance and Sexual Activity  Alcohol Use Yes   Alcohol/week: 4.0 standard drinks of alcohol   Types: 4  Glasses of wine per week   Comment: occasional    Family History:  Family History  Problem Relation Age of Onset   Arthritis Mother    Cancer Mother        breast   Glaucoma Mother    Breast cancer Mother 71   Cancer Father        lung   Hypertension Sister    Hypertension Brother    Breast cancer Maternal Aunt    Breast cancer Cousin    Breast cancer Cousin    Colon cancer Neg Hx    Ovarian cancer Neg Hx     Past medical history, surgical history, medications, allergies, family history and social history reviewed with patient today and changes made to appropriate areas of the chart.   ROS All other ROS negative except what is listed above and in the HPI.      Objective:    BP 125/76   Pulse 76   Temp 98.3 F (36.8 C) (Oral)   Ht 5' 1.25" (1.556 m)   Wt 172 lb 9.6 oz (78.3 kg)   LMP  (LMP Unknown)   SpO2 96%   BMI 32.35 kg/m   Wt Readings from Last 3 Encounters:  10/17/21 172 lb 9.6 oz (78.3 kg)  09/20/21 176 lb 9.6 oz (80.1 kg)  10/11/20 161 lb 6.4 oz (73.2 kg)    Physical Exam Vitals and nursing note reviewed. Exam conducted with a chaperone present.  Constitutional:      General: She is not in acute distress.    Appearance: Normal appearance. She is normal weight. She is not toxic-appearing.  HENT:     Head: Normocephalic and atraumatic.     Right Ear: Hearing, tympanic membrane, ear canal and external ear normal.     Left Ear: Hearing, tympanic membrane, ear canal and external ear normal.     Nose: Nose normal.     Mouth/Throat:     Mouth: Mucous membranes are moist.     Pharynx: Oropharynx is clear. No posterior oropharyngeal erythema.  Eyes:     General: Lids are normal.     Extraocular Movements: Extraocular movements intact.     Pupils: Pupils are equal,  round, and reactive to light.  Neck:     Thyroid: No thyromegaly or thyroid tenderness.     Vascular: No carotid bruit or JVD.     Trachea: Trachea normal.  Cardiovascular:     Rate and Rhythm: Normal rate.     Pulses: Normal pulses.     Heart sounds: Normal heart sounds. No murmur heard. Pulmonary:     Effort: Pulmonary effort is normal. No accessory muscle usage or respiratory distress.     Breath sounds: Normal breath sounds.  Chest:  Breasts:    Right: Normal.     Left: Normal.  Abdominal:     General: Bowel sounds are normal.     Palpations: Abdomen is soft. There is no hepatomegaly or splenomegaly.     Tenderness: There is no abdominal tenderness.  Musculoskeletal:     Cervical back: Normal range of motion and neck supple.     Right lower leg: No edema.     Left lower leg: No edema.  Lymphadenopathy:     Head:     Right side of head: No submental, submandibular, tonsillar, preauricular or posterior auricular adenopathy.     Left side of head: No submental, submandibular, tonsillar, preauricular or posterior auricular adenopathy.  Skin:  General: Skin is warm.     Capillary Refill: Capillary refill takes less than 2 seconds.  Neurological:     General: No focal deficit present.     Mental Status: She is alert.     Coordination: Coordination is intact.     Gait: Gait is intact.     Deep Tendon Reflexes: Reflexes are normal and symmetric.  Psychiatric:        Attention and Perception: Attention normal.        Mood and Affect: Mood normal.        Speech: Speech normal.        Behavior: Behavior normal. Behavior is cooperative.        Thought Content: Thought content normal.        Cognition and Memory: Cognition normal.    Results for orders placed or performed in visit on 10/11/20  WET PREP FOR [place], YEAST, CLUE   Specimen: Sterile Swab   Sterile Swab  Result Value Ref Range   Trichomonas Exam Negative Negative   Yeast Exam Negative Negative   Clue Cell Exam  Negative Negative  Microscopic Examination   Urine  Result Value Ref Range   WBC, UA 0-5 0 - 5 /hpf   RBC, Urine 0-2 0 - 2 /hpf   Epithelial Cells (non renal) 0-10 0 - 10 /hpf   Bacteria, UA None seen None seen/Few  Bayer DCA Hb A1c Waived (STAT)  Result Value Ref Range   HB A1C (BAYER DCA - WAIVED) 4.9 <7.0 %  Urinalysis, Routine w reflex microscopic  Result Value Ref Range   Specific Gravity, UA 1.010 1.005 - 1.030   pH, UA 7.0 5.0 - 7.5   Color, UA Yellow Yellow   Appearance Ur Clear Clear   Leukocytes,UA Trace (A) Negative   Protein,UA Negative Negative/Trace   Glucose, UA Negative Negative   Ketones, UA Negative Negative   RBC, UA Trace (A) Negative   Bilirubin, UA Negative Negative   Urobilinogen, Ur 0.2 0.2 - 1.0 mg/dL   Nitrite, UA Negative Negative   Microscopic Examination See below:   TSH  Result Value Ref Range   TSH 2.150 0.450 - 4.500 uIU/mL  Lipid panel  Result Value Ref Range   Cholesterol, Total 228 (H) 100 - 199 mg/dL   Triglycerides 57 0 - 149 mg/dL   HDL 104 >39 mg/dL   VLDL Cholesterol Cal 10 5 - 40 mg/dL   LDL Chol Calc (NIH) 114 (H) 0 - 99 mg/dL   Chol/HDL Ratio 2.2 0.0 - 4.4 ratio  Comprehensive metabolic panel  Result Value Ref Range   Glucose 96 65 - 99 mg/dL   BUN 15 6 - 24 mg/dL   Creatinine, Ser 0.63 0.57 - 1.00 mg/dL   eGFR 104 >59 mL/min/1.73   BUN/Creatinine Ratio 24 (H) 9 - 23   Sodium 139 134 - 144 mmol/L   Potassium 4.0 3.5 - 5.2 mmol/L   Chloride 99 96 - 106 mmol/L   CO2 26 20 - 29 mmol/L   Calcium 9.4 8.7 - 10.2 mg/dL   Total Protein 7.1 6.0 - 8.5 g/dL   Albumin 4.7 3.8 - 4.9 g/dL   Globulin, Total 2.4 1.5 - 4.5 g/dL   Albumin/Globulin Ratio 2.0 1.2 - 2.2   Bilirubin Total 0.6 0.0 - 1.2 mg/dL   Alkaline Phosphatase 86 44 - 121 IU/L   AST 27 0 - 40 IU/L   ALT 23 0 - 32 IU/L  CBC with Differential/Platelet  Result Value Ref Range   WBC 6.2 3.4 - 10.8 x10E3/uL   RBC 4.36 3.77 - 5.28 x10E6/uL   Hemoglobin 14.6 11.1 - 15.9  g/dL   Hematocrit 42.0 34.0 - 46.6 %   MCV 96 79 - 97 fL   MCH 33.5 (H) 26.6 - 33.0 pg   MCHC 34.8 31.5 - 35.7 g/dL   RDW 10.9 (L) 11.7 - 15.4 %   Platelets 292 150 - 450 x10E3/uL   Neutrophils 50 Not Estab. %   Lymphs 34 Not Estab. %   Monocytes 9 Not Estab. %   Eos 6 Not Estab. %   Basos 1 Not Estab. %   Neutrophils Absolute 3.1 1.4 - 7.0 x10E3/uL   Lymphocytes Absolute 2.1 0.7 - 3.1 x10E3/uL   Monocytes Absolute 0.6 0.1 - 0.9 x10E3/uL   EOS (ABSOLUTE) 0.4 0.0 - 0.4 x10E3/uL   Basophils Absolute 0.1 0.0 - 0.2 x10E3/uL   Immature Granulocytes 0 Not Estab. %   Immature Grans (Abs) 0.0 0.0 - 0.1 x10E3/uL      Assessment & Plan:   Problem List Items Addressed This Visit       Respiratory   Allergic rhinitis    Chronic, ongoing. Continue Zyrtec and Flonase at this time. May benefit from allergy testing in the future.      Relevant Orders   CBC with Differential/Platelet     Other   Drinks wine    Drinks about 8 glasses of wine a week.       Elevated LDL cholesterol level    Noted on past labs. No current medications. Plan to recheck lipid panel when she is fasting. Initiate medication as needed.      Relevant Orders   Comprehensive metabolic panel   Lipid Panel w/o Chol/HDL Ratio   Family history of breast cancer    Recommend she call to schedule her mammogram.       Other Visit Diagnoses     Encounter for annual physical exam    -  Primary   Annual physical today with labs and health maintenance reviewed, discussed with patient.   Relevant Orders   TSH   Vitamin D deficiency       History of low levels, continue supplement and recheck today.   Relevant Orders   VITAMIN D 25 Hydroxy (Vit-D Deficiency, Fractures)        Follow up plan: Return in about 1 year (around 10/18/2022) for Annual Physical after 10/18/22 + NEEDS outpatient lab visit this week or next.   LABORATORY TESTING:  - Pap smear: up to date  IMMUNIZATIONS:   - Tdap: Tetanus vaccination  status reviewed: last tetanus booster within 10 years. - Influenza: Up to date - Pneumovax: Not applicable - Prevnar: Not applicable - COVID: Up to date - HPV: Not applicable - Shingrix vaccine: Refused  SCREENING: -Mammogram: Ordered today  - Colonoscopy: Up to date  - Bone Density: Up to date  -Hearing Test: Not applicable -Spirometry: Not applicable   PATIENT COUNSELING:   Advised to take 1 mg of folate supplement per day if capable of pregnancy.   Sexuality: Discussed sexually transmitted diseases, partner selection, use of condoms, avoidance of unintended pregnancy  and contraceptive alternatives.   Advised to avoid cigarette smoking.  I discussed with the patient that most people either abstain from alcohol or drink within safe limits (<=14/week and <=4 drinks/occasion for males, <=7/weeks and <= 3 drinks/occasion for females) and that the risk for alcohol disorders and  other health effects rises proportionally with the number of drinks per week and how often a drinker exceeds daily limits.  Discussed cessation/primary prevention of drug use and availability of treatment for abuse.   Diet: Encouraged to adjust caloric intake to maintain  or achieve ideal body weight, to reduce intake of dietary saturated fat and total fat, to limit sodium intake by avoiding high sodium foods and not adding table salt, and to maintain adequate dietary potassium and calcium preferably from fresh fruits, vegetables, and low-fat dairy products.    Stressed the importance of regular exercise  Injury prevention: Discussed safety belts, safety helmets, smoke detector, smoking near bedding or upholstery.   Dental health: Discussed importance of regular tooth brushing, flossing, and dental visits.    NEXT PREVENTATIVE PHYSICAL DUE IN 1 YEAR. Return in about 1 year (around 10/18/2022) for Annual Physical after 10/18/22 + NEEDS outpatient lab visit this week or next.

## 2021-10-17 NOTE — Assessment & Plan Note (Signed)
Chronic, ongoing. Continue Zyrtec and Flonase at this time. May benefit from allergy testing in the future.

## 2021-10-17 NOTE — Assessment & Plan Note (Signed)
Drinks about 8 glasses of wine a week.

## 2021-10-17 NOTE — Assessment & Plan Note (Signed)
Noted on past labs. No current medications. Plan to recheck lipid panel when she is fasting. Initiate medication as needed.

## 2021-10-24 ENCOUNTER — Other Ambulatory Visit: Payer: BC Managed Care – PPO

## 2021-10-24 DIAGNOSIS — E538 Deficiency of other specified B group vitamins: Secondary | ICD-10-CM

## 2021-10-24 DIAGNOSIS — L237 Allergic contact dermatitis due to plants, except food: Secondary | ICD-10-CM

## 2021-10-24 DIAGNOSIS — E7800 Pure hypercholesterolemia, unspecified: Secondary | ICD-10-CM

## 2021-10-24 DIAGNOSIS — J301 Allergic rhinitis due to pollen: Secondary | ICD-10-CM | POA: Diagnosis not present

## 2021-10-24 DIAGNOSIS — E559 Vitamin D deficiency, unspecified: Secondary | ICD-10-CM

## 2021-10-24 DIAGNOSIS — Z0000 Encounter for general adult medical examination without abnormal findings: Secondary | ICD-10-CM | POA: Diagnosis not present

## 2021-10-24 IMAGING — MG MM DIGITAL DIAGNOSTIC UNILAT*R* W/ TOMO W/ CAD
6 series · 6 of 18 positions shown · non-contrast
Comparison: Previous exam(s).

CLINICAL DATA: 54-year-old female recalled from screening mammogram
dated 04/26/2020 for possible right breast distortion.

EXAM:
DIGITAL DIAGNOSTIC UNILATERAL RIGHT MAMMOGRAM WITH TOMOSYNTHESIS AND
CAD; ULTRASOUND RIGHT BREAST LIMITED
TECHNIQUE: Right digital diagnostic mammography and breast tomosynthesis was
performed. Digital images of the breasts were evaluated with
computer-aided detection. Targeted ultrasound examination of the
Right breast was performed.

[R ML synth-2D]
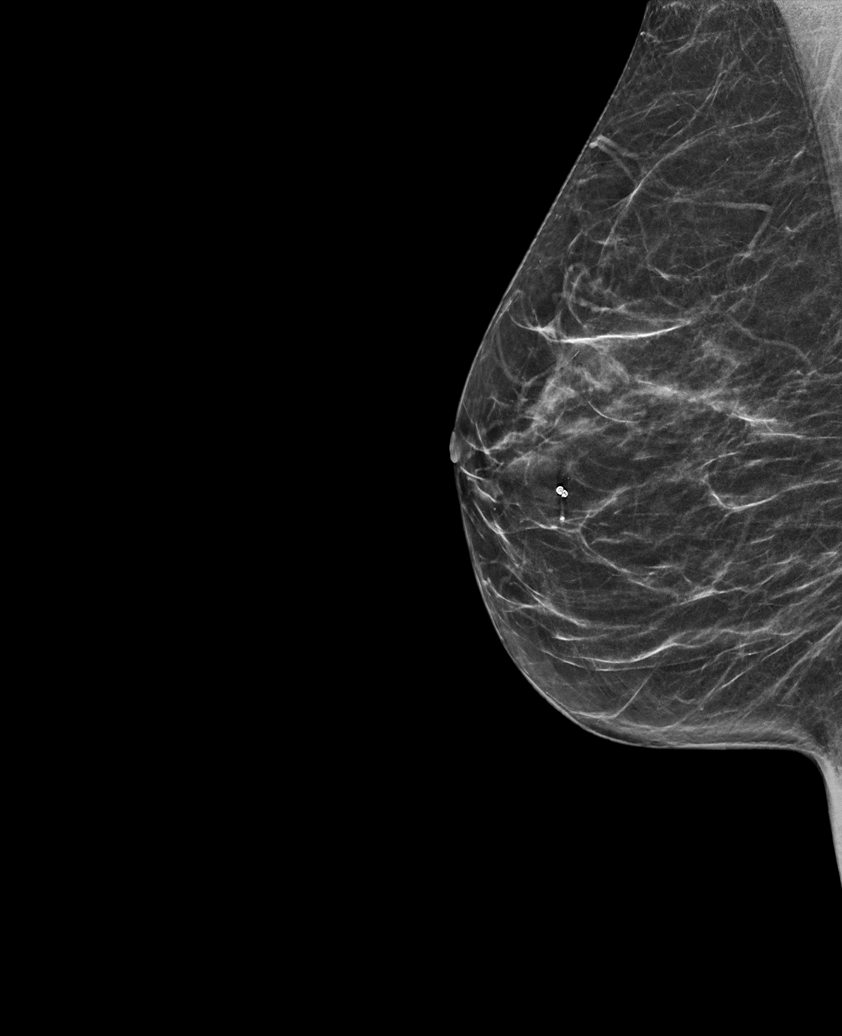

[R CC synth-2D]
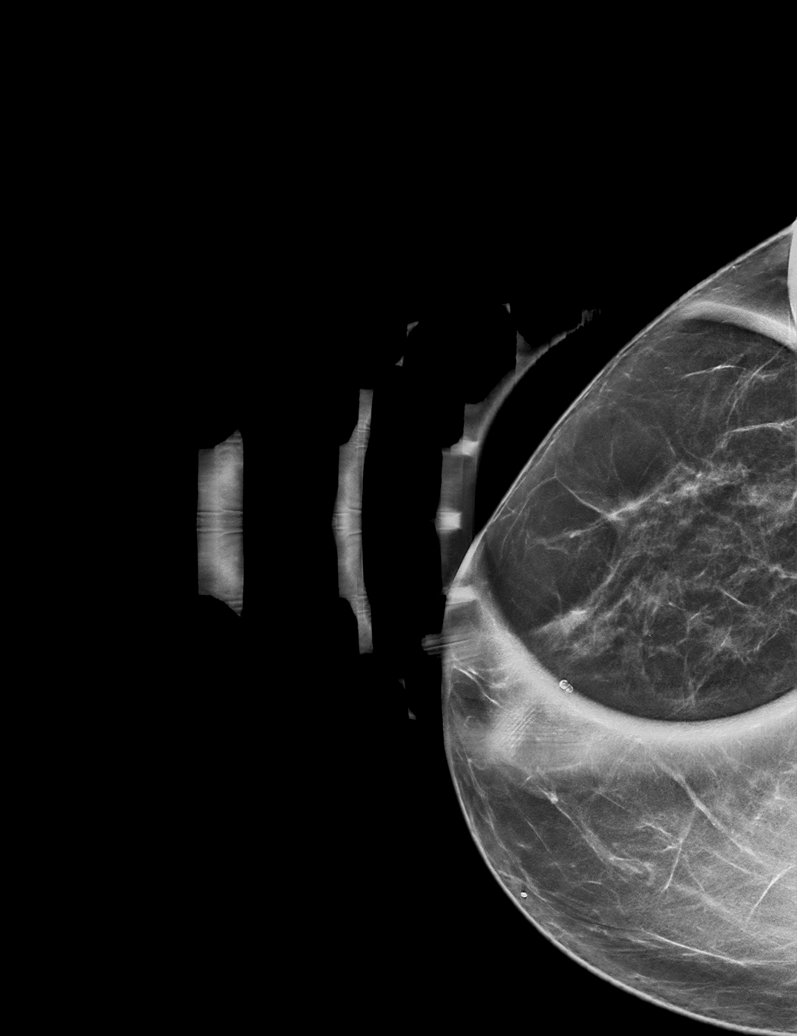

[R MLO synth-2D]
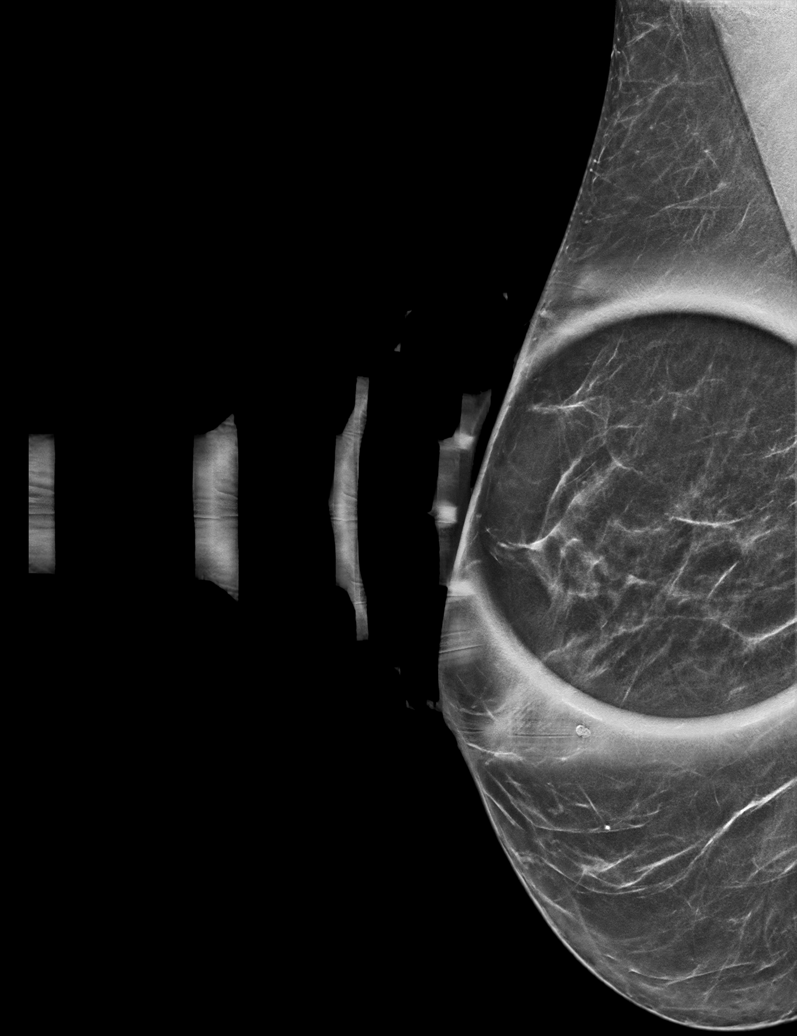

[R MLO tomo · tomo slice 29/57.0]
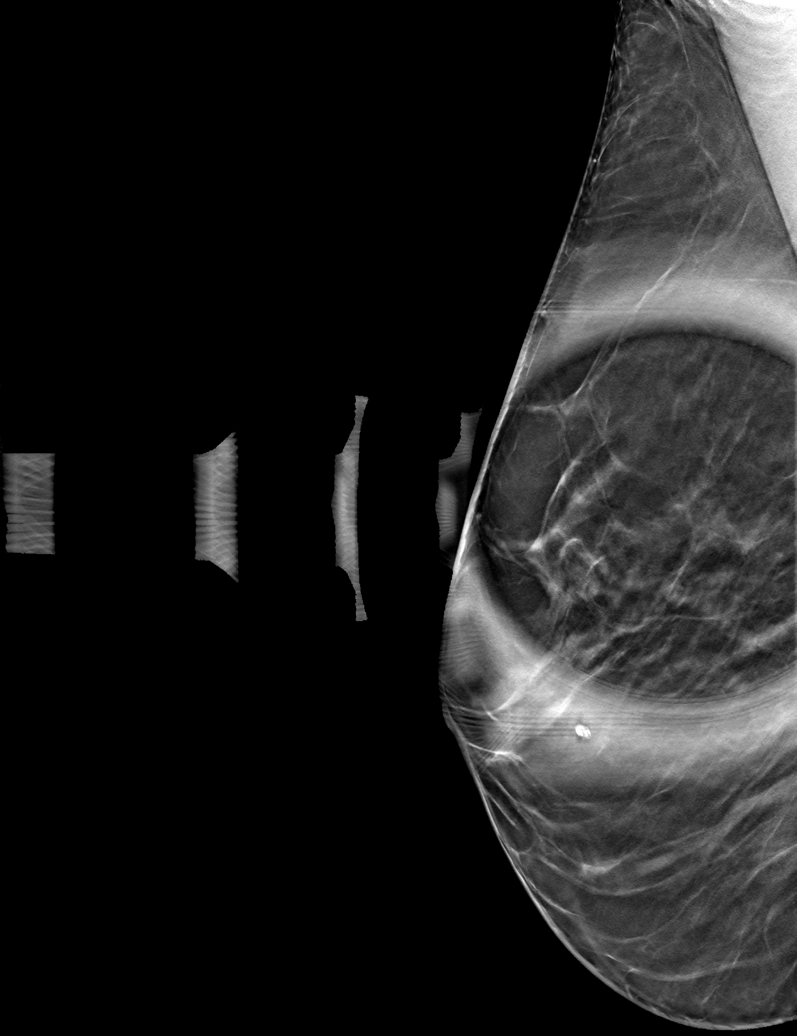

[R ML tomo · tomo slice 28/55.0]
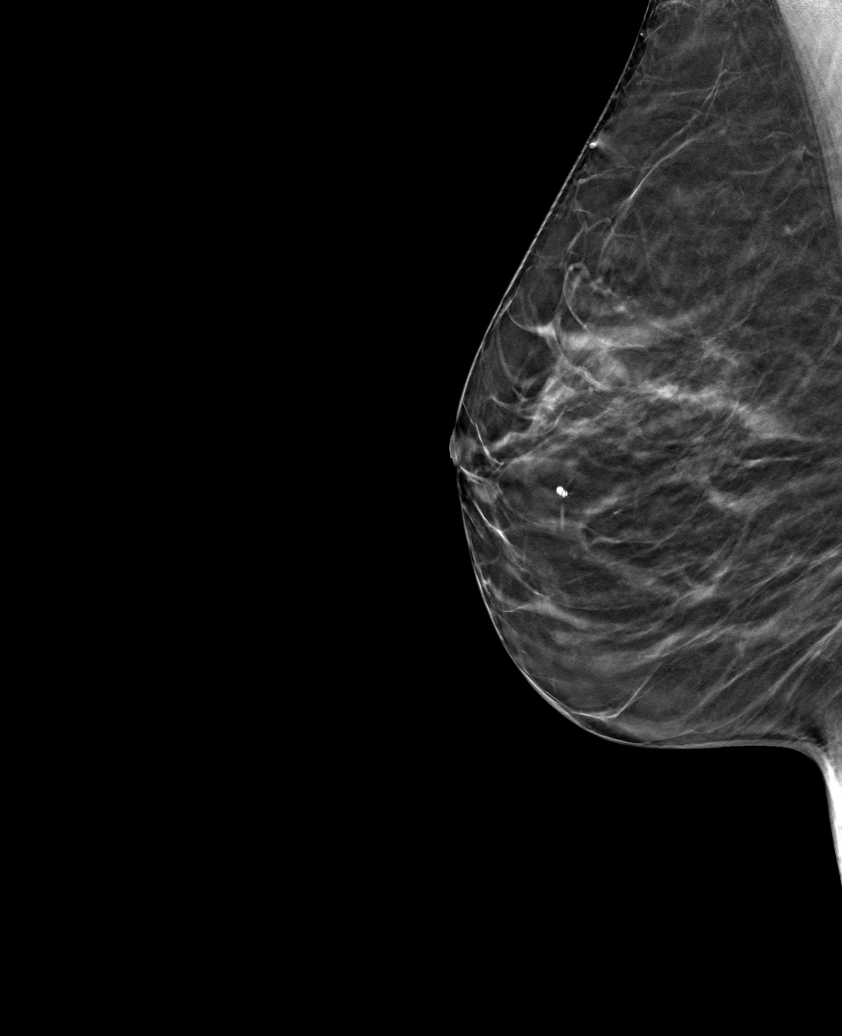

[R CC tomo · tomo slice 27/54.0]
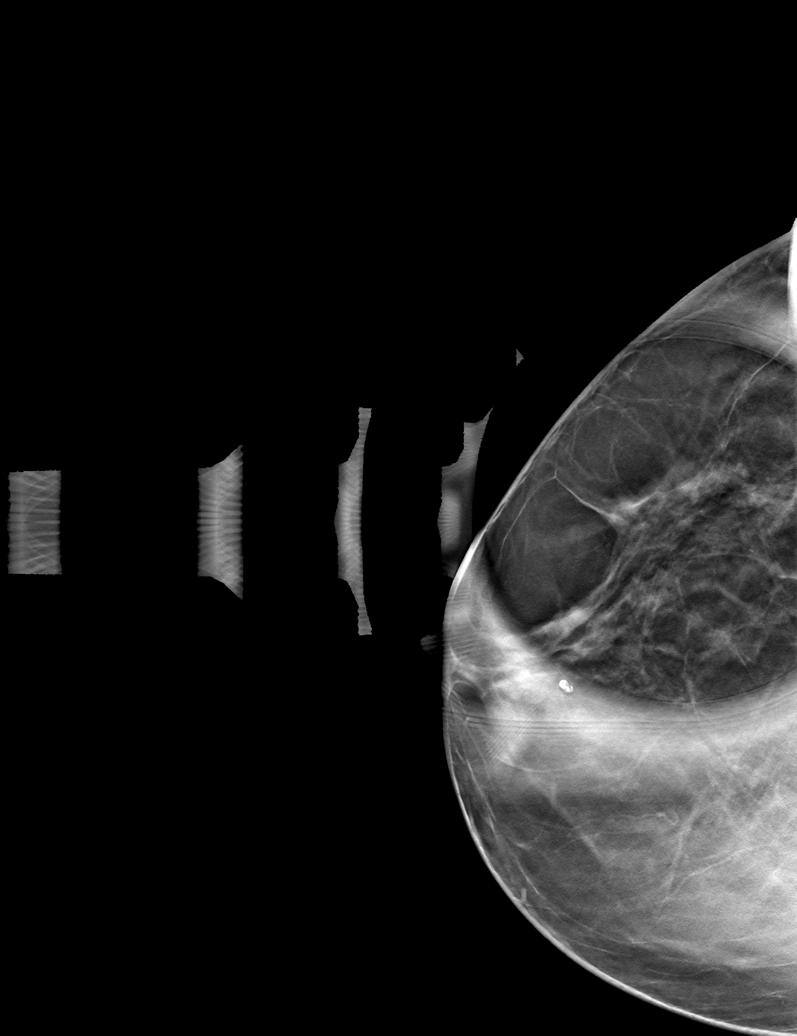

[6 of 18 positions shown; findings below may reference images not displayed]

ACR Breast Density Category b: There are scattered areas of
fibroglandular density.
FINDINGS: There is persistent, subtle distortion in the upper right breast at
anterior depth. Further evaluation with ultrasound was performed.

Targeted ultrasound is performed, showing no focal or suspicious
sonographic abnormalities in the upper right breast.

Evaluation of the right axilla demonstrates no suspicious
lymphadenopathy.
IMPRESSION: 1. Persistent right breast distortion without sonographic correlate.
Recommendation is for stereotactic biopsy.
2. No suspicious right axillary lymphadenopathy.

RECOMMENDATION:
Stereotactic biopsy of the right breast.

I have discussed the findings and recommendations with the patient.
If applicable, a reminder letter will be sent to the patient
regarding the next appointment.

BI-RADS CATEGORY  4: Suspicious.

## 2021-10-25 LAB — CBC WITH DIFFERENTIAL/PLATELET
Basophils Absolute: 0.1 10*3/uL (ref 0.0–0.2)
Basos: 1 %
EOS (ABSOLUTE): 0.3 10*3/uL (ref 0.0–0.4)
Eos: 5 %
Hematocrit: 43.7 % (ref 34.0–46.6)
Hemoglobin: 15 g/dL (ref 11.1–15.9)
Immature Grans (Abs): 0 10*3/uL (ref 0.0–0.1)
Immature Granulocytes: 0 %
Lymphocytes Absolute: 1.5 10*3/uL (ref 0.7–3.1)
Lymphs: 27 %
MCH: 33.9 pg — ABNORMAL HIGH (ref 26.6–33.0)
MCHC: 34.3 g/dL (ref 31.5–35.7)
MCV: 99 fL — ABNORMAL HIGH (ref 79–97)
Monocytes Absolute: 0.5 10*3/uL (ref 0.1–0.9)
Monocytes: 9 %
Neutrophils Absolute: 3.4 10*3/uL (ref 1.4–7.0)
Neutrophils: 58 %
Platelets: 289 10*3/uL (ref 150–450)
RBC: 4.43 x10E6/uL (ref 3.77–5.28)
RDW: 11.6 % — ABNORMAL LOW (ref 11.7–15.4)
WBC: 5.7 10*3/uL (ref 3.4–10.8)

## 2021-10-25 LAB — LIPID PANEL W/O CHOL/HDL RATIO
Cholesterol, Total: 238 mg/dL — ABNORMAL HIGH (ref 100–199)
HDL: 117 mg/dL (ref >39)
LDL Chol Calc (NIH): 112 mg/dL — ABNORMAL HIGH (ref 0–99)
Triglycerides: 56 mg/dL (ref 0–149)
VLDL Cholesterol Cal: 9 mg/dL (ref 5–40)

## 2021-10-25 LAB — COMPREHENSIVE METABOLIC PANEL
ALT: 15 IU/L (ref 0–32)
AST: 18 IU/L (ref 0–40)
Albumin/Globulin Ratio: 1.8 (ref 1.2–2.2)
Albumin: 4.4 g/dL (ref 3.8–4.9)
Alkaline Phosphatase: 91 IU/L (ref 44–121)
BUN/Creatinine Ratio: 17 (ref 9–23)
BUN: 11 mg/dL (ref 6–24)
Bilirubin Total: 0.4 mg/dL (ref 0.0–1.2)
CO2: 25 mmol/L (ref 20–29)
Calcium: 9.5 mg/dL (ref 8.7–10.2)
Chloride: 103 mmol/L (ref 96–106)
Creatinine, Ser: 0.64 mg/dL (ref 0.57–1.00)
Globulin, Total: 2.4 g/dL (ref 1.5–4.5)
Glucose: 100 mg/dL — ABNORMAL HIGH (ref 70–99)
Potassium: 4.4 mmol/L (ref 3.5–5.2)
Sodium: 143 mmol/L (ref 134–144)
Total Protein: 6.8 g/dL (ref 6.0–8.5)
eGFR: 103 mL/min/{1.73_m2} (ref >59)

## 2021-10-25 LAB — TSH: TSH: 2.5 u[IU]/mL (ref 0.450–4.500)

## 2021-10-25 LAB — VITAMIN D 25 HYDROXY (VIT D DEFICIENCY, FRACTURES): Vit D, 25-Hydroxy: 37 ng/mL (ref 30.0–100.0)

## 2021-10-25 LAB — VITAMIN B12: Vitamin B-12: 424 pg/mL (ref 232–1245)

## 2021-10-25 NOTE — Progress Notes (Signed)
Contacted via MyChart The ASCVD Risk score (Arnett DK, et al., 2019) failed to calculate for the following reasons:   The valid HDL cholesterol range is 20 to 100 mg/dL   Good morning Dana Jarvis, your labs have returned: - Kidney function, creatinine and eGFR, remains normal, as is liver function, AST and ALT.  Glucose mild elevation, monitor diet and sugar intake. - CBC shows no significant anemia and no infection.  - Vitamin D, B12 and thyroid levels stable. - Your cholesterol is still high, but continued recommendations to make lifestyle changes. Your LDL is above normal. The LDL is the bad cholesterol. Over time and in combination with inflammation and other factors, this contributes to plaque which in turn may lead to stroke and/or heart attack down the road. Sometimes high LDL is primarily genetic, and people might be eating all the right foods but still have high numbers. Other times, there is room for improvement in one's diet and eating healthier can bring this number down and potentially reduce one's risk of heart attack and/or stroke.   To reduce your LDL, Remember - more fruits and vegetables, more fish, and limit red meat and dairy products. More soy, nuts, beans, barley, lentils, oats and plant sterol ester enriched margarine instead of butter. I also encourage eliminating sugar and processed food. Remember, shop on the outside of the grocery store and visit your [employer]. If you would like to talk with me about dietary changes for your cholesterol, please let me know. We should recheck your cholesterol in 12 months.  Any questions? Keep being amazing!!  Thank you for allowing me to participate in your care.  I appreciate you. Kindest regards, Dana Jarvis

## 2021-11-02 ENCOUNTER — Ambulatory Visit (INDEPENDENT_AMBULATORY_CARE_PROVIDER_SITE_OTHER): Payer: Self-pay | Admitting: Nurse Practitioner

## 2021-11-02 ENCOUNTER — Encounter: Payer: Self-pay | Admitting: Nurse Practitioner

## 2021-11-02 VITALS — BP 130/90 | HR 86 | Temp 98.4°F | Ht 62.0 in | Wt 173.2 lb

## 2021-11-02 DIAGNOSIS — B372 Candidiasis of skin and nail: Secondary | ICD-10-CM

## 2021-11-02 DIAGNOSIS — L089 Local infection of the skin and subcutaneous tissue, unspecified: Secondary | ICD-10-CM

## 2021-11-02 MED ORDER — MUPIROCIN 2 % EX OINT: 1.0000 | TOPICAL_OINTMENT | Freq: Two times a day (BID) | CUTANEOUS | 0 refills | Status: AC

## 2021-11-02 MED ORDER — NYSTATIN 100000 UNIT/GM EX POWD: 1.0000 | Freq: Three times a day (TID) | CUTANEOUS | 0 refills | Status: DC

## 2021-11-02 NOTE — Progress Notes (Signed)
   Subjective:    Patient ID: Dana Jarvis, female    DOB: May 23, 1965, 56 y.o.   MRN: [id number]  HPI  56 year old female presenting to [facility] with complaints of spot on her right leg. She cut it open while weed eating. Then went swimming in a pool afterwards and is worried it may be infected.   She has also continued to suffer from yeast under her breasts and in her groin. Was recently at her PCP for her annual physical and was given a 5 days diflucan Rx she recently finished.   Today's Vitals   11/02/21 1214  BP: (!) 130/90  Pulse: 86  Temp: 98.4 F (36.9 C)  TempSrc: Tympanic  SpO2: 98%  Weight: 173 lb 3.2 oz (78.6 kg)  Height: '5\' 2"'$  (1.575 m)   Body mass index is 31.68 kg/m.      Review of Systems  Constitutional: Negative.   HENT: Negative.    Respiratory: Negative.    Cardiovascular: Negative.   Skin:  Positive for rash and wound.       Objective:   Physical Exam HENT:     Head: Normocephalic.  Pulmonary:     Effort: Pulmonary effort is normal.  Skin:    General: Skin is warm.          Comments: Quarter sized wound to right inner calf central open no active drainage, erythema surrounding   Neurological:     General: No focal deficit present.     Mental Status: She is alert.           Assessment & Plan:  1. Skin infection  - mupirocin ointment (BACTROBAN) 2 %; Apply 1 Application topically 2 (two) times daily for 11 days.  Dispense: 22 g; Refill: 0  2. Skin yeast infection  - nystatin (MYCOSTATIN/NYSTOP) powder; Apply 1 Application topically 3 (three) times daily.  Dispense: 15 g; Refill: 0

## 2021-11-09 ENCOUNTER — Ambulatory Visit
Admission: RE | Admit: 2021-11-09 | Discharge: 2021-11-09 | Disposition: A | Discharge status: Home / Self Care | Payer: BC Managed Care – PPO | Source: Ambulatory Visit | Attending: Nurse Practitioner | Admitting: Nurse Practitioner

## 2021-11-09 DIAGNOSIS — Z1231 Encounter for screening mammogram for malignant neoplasm of breast: Secondary | ICD-10-CM | POA: Diagnosis not present

## 2021-11-12 NOTE — Progress Notes (Signed)
Contacted via MyChart   Normal mammogram, may repeat in one year:)

## 2021-11-16 ENCOUNTER — Encounter: Payer: Self-pay | Admitting: Nurse Practitioner

## 2021-11-16 ENCOUNTER — Ambulatory Visit (INDEPENDENT_AMBULATORY_CARE_PROVIDER_SITE_OTHER): Payer: Self-pay | Admitting: Nurse Practitioner

## 2021-11-16 VITALS — BP 124/86 | HR 83 | Temp 99.8°F | Ht 62.0 in | Wt 174.4 lb

## 2021-11-16 DIAGNOSIS — J069 Acute upper respiratory infection, unspecified: Secondary | ICD-10-CM

## 2021-11-16 LAB — POCT RAPID STREP A (OFFICE): Rapid Strep A Screen: NEGATIVE

## 2021-11-16 LAB — POC COVID19 BINAXNOW: SARS Coronavirus 2 Ag: NEGATIVE

## 2021-11-16 MED ORDER — FLUTICASONE PROPIONATE 50 MCG/ACT NA SUSP: 2.0000 | Freq: Every day | NASAL | 6 refills | Status: DC

## 2021-11-16 NOTE — Progress Notes (Signed)
   Subjective:    Patient ID: Dana Jarvis, female    DOB: Sep 07, 1965, 56 y.o.   MRN: [id number]  HPI  56 year old female presenting to [facility] with sore throat and fever for 48 hours now.   Boyfriend had URI symptoms last week- has recovered no known strep or COVID exposure  She has been using aspirin and tylenol for relief  Feeling slightly better today   Today's Vitals   11/16/21 0752  BP: 124/86  Pulse: 83  Temp: 99.8 F (37.7 C)  TempSrc: Tympanic  SpO2: 99%  Weight: 174 lb 6.4 oz (79.1 kg)  Height: '5\' 2"'$  (1.575 m)   Body mass index is 31.9 kg/m.   Past Medical History:  Diagnosis Date   Arthritis    hands, feet   Complication of anesthesia    has awakened during procedures   Headache    sinus   Motion sickness    ocean ships   Seasonal allergies    Wears contact lenses      Review of Systems  Constitutional:  Positive for fever.  HENT:  Positive for sore throat.   Respiratory: Negative.    Cardiovascular: Negative.   Genitourinary: Negative.   Musculoskeletal: Negative.   Neurological: Negative.   Psychiatric/Behavioral: Negative.         Objective:   Physical Exam HENT:     Head: Normocephalic.     Right Ear: Tympanic membrane and ear canal normal.     Left Ear: Tympanic membrane and ear canal normal.     Nose: No congestion or rhinorrhea.     Mouth/Throat:     Mouth: Mucous membranes are moist.     Pharynx: No oropharyngeal exudate or posterior oropharyngeal erythema.  Eyes:     Conjunctiva/sclera: Conjunctivae normal.  Cardiovascular:     Rate and Rhythm: Normal rate.     Heart sounds: Normal heart sounds.  Pulmonary:     Effort: Pulmonary effort is normal.     Breath sounds: Normal breath sounds.  Musculoskeletal:     Cervical back: Normal range of motion.  Skin:    General: Skin is warm.  Neurological:     Mental Status: She is alert.  Psychiatric:        Mood and Affect: Mood normal.     Component Ref  Range & Units 08:24 3 yr ago  Rapid Strep A Screen Negative Negative  Negative          Specimen Collected: 11/16/21 08:24 Last Resulted: 11/16/21 08:24       SARS Coronavirus 2 Ag Negative Negative          Specimen Collected: 11/16/21 08:23 Last Resulted: 11/16/21 08:23           Assessment & Plan:  1. Viral upper respiratory tract infection  - fluticasone (FLONASE) 50 MCG/ACT nasal spray; Place 2 sprays into both nostrils daily.  Dispense: 16 g; Refill: 6 - POC COVID-19 negative - POCT rapid strep A negative   RTC if symptoms persist or with new concerns as discussed Push fluids/ rest May return to work when fever free for 24 hours     I spent approximately 10 minutes reviewing the patient's history, current symptoms and coordinating their care today.

## 2022-04-22 ENCOUNTER — Ambulatory Visit (INDEPENDENT_AMBULATORY_CARE_PROVIDER_SITE_OTHER): Payer: Self-pay | Admitting: Physician Assistant

## 2022-04-22 ENCOUNTER — Encounter: Payer: Self-pay | Admitting: Physician Assistant

## 2022-04-22 VITALS — BP 122/82 | HR 70 | Temp 97.8°F | Wt 178.2 lb

## 2022-04-22 DIAGNOSIS — J9801 Acute bronchospasm: Secondary | ICD-10-CM

## 2022-04-22 DIAGNOSIS — J40 Bronchitis, not specified as acute or chronic: Secondary | ICD-10-CM

## 2022-04-22 DIAGNOSIS — J069 Acute upper respiratory infection, unspecified: Secondary | ICD-10-CM

## 2022-04-22 LAB — POC SOFIA 2 FLU + SARS ANTIGEN FIA
Influenza A, POC: NEGATIVE
Influenza B, POC: NEGATIVE
SARS Coronavirus 2 Ag: NEGATIVE

## 2022-04-22 MED ORDER — AZITHROMYCIN 250 MG PO TABS: ORAL_TABLET | ORAL | 0 refills | Status: DC

## 2022-04-22 MED ORDER — ALBUTEROL SULFATE HFA 108 (90 BASE) MCG/ACT IN AERS: 2.0000 | INHALATION_SPRAY | Freq: Four times a day (QID) | RESPIRATORY_TRACT | 1 refills | Status: DC | PRN

## 2022-04-22 MED ORDER — METHYLPREDNISOLONE 4 MG PO TBPK: ORAL_TABLET | ORAL | 0 refills | Status: DC

## 2022-04-22 NOTE — Progress Notes (Signed)
[facility] [address], [place] [address]   Office Visit Note  Patient Name: Dana Jarvis Date of Birth [id number]  Medical Record number [id number]  Date of Service: 04/22/2022  Chief Complaint  Patient presents with   Sinusitis    Started with cough last Wed. Started feeling bad this morning. Congestion, cough, wheezing at night. No ST, fever, BA      57 y/o F presents to the clinic for c/o productive cough x 5 days and progressively getting worse. She also noticed wheezing with coughing fits. She denies fever, sore throat, CP, SOB, or body aches. +exposure to covid. Took Delsym and Ibuprofen to help with symptoms.   Sinusitis Associated symptoms include congestion and coughing. Pertinent negatives include no shortness of breath, sinus pressure or sore throat.      Current Medication:  Outpatient Encounter Medications as of 04/22/2022  Medication Sig   albuterol (VENTOLIN HFA) 108 (90 Base) MCG/ACT inhaler Inhale 2 puffs into the lungs every 6 (six) hours as needed for wheezing or shortness of breath.   azithromycin (ZITHROMAX) 250 MG tablet Take 2 tablets on day 1, then 1 tablet daily on days 2 through 5   Bacillus Coagulans-Inulin (PROBIOTIC-PREBIOTIC) 1-250 BILLION-MG CAPS Take by mouth.   Cholecalciferol 50 MCG (2000 UT) TBDP Take 2,000 Units by mouth daily.   fluticasone (FLONASE) 50 MCG/ACT nasal spray Place 2 sprays into both nostrils daily.   ibuprofen (ADVIL) 200 MG tablet Take 200 mg by mouth every 6 (six) hours as needed.   methylPREDNISolone (MEDROL DOSEPAK) 4 MG TBPK tablet Take as directed   Multiple Vitamins-Minerals (MULTIVITAMIN WITH MINERALS) tablet Take 1 tablet by mouth daily.   vitamin B-12 (CYANOCOBALAMIN) 1000 MCG tablet Take 1,000 mcg by mouth daily.   cetirizine (ZYRTEC) 10 MG tablet Take 10 mg by mouth daily. (Patient not taking: Reported on 11/16/2021)   fluconazole (DIFLUCAN) 150 MG tablet Take one tablet (150 MG) by mouth once a  week as needed for skin rash if present. (Patient not taking: Reported on 11/16/2021)   fluticasone (FLONASE) 50 MCG/ACT nasal spray Place 1 spray into both nostrils 2 (two) times daily as needed. (Patient not taking: Reported on 11/02/2021)   nystatin (MYCOSTATIN/NYSTOP) powder Apply 1 Application topically 3 (three) times daily. (Patient not taking: Reported on 04/22/2022)   predniSONE (DELTASONE) 10 MG tablet Take 6 tablets by mouth daily for 2 days, then reduce by 1 tablet every 2 days until gone (Patient not taking: Reported on 10/17/2021)   No facility-administered encounter medications on file as of 04/22/2022.      Medical History: Past Medical History:  Diagnosis Date   Arthritis    hands, feet   Complication of anesthesia    has awakened during procedures   Headache    sinus   Motion sickness    ocean ships   Seasonal allergies    Wears contact lenses      Vital Signs: BP 122/82 (BP Location: Left Arm, Patient Position: Sitting, Cuff Size: Normal)   Pulse 70   Temp 97.8 F (36.6 C) (Tympanic)   Wt 178 lb 3.2 oz (80.8 kg)   LMP  (LMP Unknown)   SpO2 99%   BMI 32.59 kg/m    Review of Systems  Constitutional: Negative.   HENT:  Positive for congestion and postnasal drip. Negative for sinus pressure, sinus pain, sore throat and trouble swallowing.   Respiratory:  Positive for cough and wheezing. Negative for chest tightness  and shortness of breath.   Cardiovascular: Negative.   Neurological: Negative.     Physical Exam Constitutional:      Appearance: Normal appearance.  HENT:     Head: Atraumatic.     Right Ear: Tympanic membrane, ear canal and external ear normal.     Left Ear: Tympanic membrane, ear canal and external ear normal.     Nose: Nose normal.     Right Turbinates: Enlarged.     Left Turbinates: Enlarged.     Right Sinus: No maxillary sinus tenderness or frontal sinus tenderness.     Left Sinus: No maxillary sinus tenderness or frontal sinus  tenderness.     Mouth/Throat:     Mouth: Mucous membranes are moist.     Pharynx: Oropharynx is clear.  Eyes:     Extraocular Movements: Extraocular movements intact.  Cardiovascular:     Rate and Rhythm: Normal rate and regular rhythm.  Pulmonary:     Effort: Pulmonary effort is normal.     Breath sounds: Normal breath sounds.  Musculoskeletal:     Cervical back: Neck supple.  Skin:    General: Skin is warm.  Neurological:     Mental Status: She is alert.  Psychiatric:        Mood and Affect: Mood normal.        Behavior: Behavior normal.        Thought Content: Thought content normal.        Judgment: Judgment normal.       Assessment/Plan:  1. URI with cough and congestion - POC SOFIA 2 FLU + SARS ANTIGEN FIA - albuterol (VENTOLIN HFA) 108 (90 Base) MCG/ACT inhaler; Inhale 2 puffs into the lungs every 6 (six) hours as needed for wheezing or shortness of breath.  Dispense: 8 g; Refill: 1  2. Bronchospasm - methylPREDNISolone (MEDROL DOSEPAK) 4 MG TBPK tablet; Take as directed  Dispense: 1 each; Refill: 0 - albuterol (VENTOLIN HFA) 108 (90 Base) MCG/ACT inhaler; Inhale 2 puffs into the lungs every 6 (six) hours as needed for wheezing or shortness of breath.  Dispense: 8 g; Refill: 1 - azithromycin (ZITHROMAX) 250 MG tablet; Take 2 tablets on day 1, then 1 tablet daily on days 2 through 5  Dispense: 6 tablet; Refill: 0  3. Bronchitis - azithromycin (ZITHROMAX) 250 MG tablet; Take 2 tablets on day 1, then 1 tablet daily on days 2 through 5  Dispense: 6 tablet; Refill: 0  Reviewed negative covid and flu test results with patient. She verbalized understanding. Reviewed my clinical findings.  Increase fluids Start a humidifier Continue with Delsym as directed on the box Consider Mucinex  Start prescriptions for oral steroids and inhaler as prescribed. If symptoms don't improve in 2-3 days after starting medicines, then consider starting oral antibiotic.  Pt verbalized  understanding and in agreement.    General Counseling: Xandrea verbalizes understanding of the findings of todays visit and agrees with plan of treatment. I have discussed any further diagnostic evaluation that may be needed or ordered today. We also reviewed her medications today. she has been encouraged to call the office with any questions or concerns that should arise related to todays visit.    Time spent:20 [place], [place] Physician Assistant

## 2022-06-16 DIAGNOSIS — H1033 Unspecified acute conjunctivitis, bilateral: Secondary | ICD-10-CM | POA: Diagnosis not present

## 2022-06-16 DIAGNOSIS — J309 Allergic rhinitis, unspecified: Secondary | ICD-10-CM | POA: Diagnosis not present

## 2022-08-20 ENCOUNTER — Encounter: Payer: Self-pay | Admitting: Physician Assistant

## 2022-08-20 ENCOUNTER — Other Ambulatory Visit: Payer: Self-pay

## 2022-08-20 ENCOUNTER — Emergency Department: Payer: BC Managed Care – PPO

## 2022-08-20 ENCOUNTER — Ambulatory Visit (INDEPENDENT_AMBULATORY_CARE_PROVIDER_SITE_OTHER): Payer: Self-pay | Admitting: Physician Assistant

## 2022-08-20 ENCOUNTER — Encounter: Payer: Self-pay | Admitting: Intensive Care

## 2022-08-20 ENCOUNTER — Inpatient Hospital Stay
Admission: EM | Admit: 2022-08-20 | Discharge: 2022-08-22 | DRG: 373 | Disposition: A | Discharge status: Home / Self Care | Payer: BC Managed Care – PPO | Attending: Surgery | Admitting: Surgery

## 2022-08-20 VITALS — BP 142/89 | HR 90 | Temp 99.5°F | Resp 18 | Wt 173.0 lb

## 2022-08-20 DIAGNOSIS — Z882 Allergy status to sulfonamides status: Secondary | ICD-10-CM | POA: Diagnosis not present

## 2022-08-20 DIAGNOSIS — Z90710 Acquired absence of both cervix and uterus: Secondary | ICD-10-CM | POA: Diagnosis not present

## 2022-08-20 DIAGNOSIS — K37 Unspecified appendicitis: Secondary | ICD-10-CM | POA: Diagnosis present

## 2022-08-20 DIAGNOSIS — Z90722 Acquired absence of ovaries, bilateral: Secondary | ICD-10-CM | POA: Diagnosis not present

## 2022-08-20 DIAGNOSIS — Z91040 Latex allergy status: Secondary | ICD-10-CM | POA: Diagnosis not present

## 2022-08-20 DIAGNOSIS — Z8249 Family history of ischemic heart disease and other diseases of the circulatory system: Secondary | ICD-10-CM | POA: Diagnosis not present

## 2022-08-20 DIAGNOSIS — Z8261 Family history of arthritis: Secondary | ICD-10-CM

## 2022-08-20 DIAGNOSIS — K3580 Unspecified acute appendicitis: Secondary | ICD-10-CM

## 2022-08-20 DIAGNOSIS — Z803 Family history of malignant neoplasm of breast: Secondary | ICD-10-CM

## 2022-08-20 DIAGNOSIS — Z9079 Acquired absence of other genital organ(s): Secondary | ICD-10-CM

## 2022-08-20 DIAGNOSIS — K353 Acute appendicitis with localized peritonitis, without perforation or gangrene: Principal | ICD-10-CM

## 2022-08-20 DIAGNOSIS — K3532 Acute appendicitis with perforation and localized peritonitis, without abscess: Secondary | ICD-10-CM

## 2022-08-20 DIAGNOSIS — R1031 Right lower quadrant pain: Secondary | ICD-10-CM

## 2022-08-20 DIAGNOSIS — R000 Tachycardia, unspecified: Secondary | ICD-10-CM | POA: Diagnosis not present

## 2022-08-20 DIAGNOSIS — Z888 Allergy status to other drugs, medicaments and biological substances status: Secondary | ICD-10-CM | POA: Diagnosis not present

## 2022-08-20 DIAGNOSIS — Z83511 Family history of glaucoma: Secondary | ICD-10-CM | POA: Diagnosis not present

## 2022-08-20 DIAGNOSIS — Z79899 Other long term (current) drug therapy: Secondary | ICD-10-CM

## 2022-08-20 DIAGNOSIS — R109 Unspecified abdominal pain: Secondary | ICD-10-CM | POA: Diagnosis not present

## 2022-08-20 HISTORY — DX: Acute appendicitis with perforation, localized peritonitis, and gangrene, without abscess: K35.32

## 2022-08-20 LAB — URINALYSIS, ROUTINE W REFLEX MICROSCOPIC
Bilirubin Urine: NEGATIVE
Glucose, UA: NEGATIVE mg/dL
Hgb urine dipstick: NEGATIVE
Ketones, ur: 80 mg/dL — AB
Nitrite: NEGATIVE
Protein, ur: 100 mg/dL — AB
Specific Gravity, Urine: 1.027 (ref 1.005–1.030)
pH: 5 (ref 5.0–8.0)

## 2022-08-20 LAB — CBC
HCT: 40.8 % (ref 36.0–46.0)
Hemoglobin: 14.1 g/dL (ref 12.0–15.0)
MCH: 33.3 pg (ref 26.0–34.0)
MCHC: 34.6 g/dL (ref 30.0–36.0)
MCV: 96.2 fL (ref 80.0–100.0)
Platelets: 262 10*3/uL (ref 150–400)
RBC: 4.24 MIL/uL (ref 3.87–5.11)
RDW: 11.2 % — ABNORMAL LOW (ref 11.5–15.5)
WBC: 15.6 10*3/uL — ABNORMAL HIGH (ref 4.0–10.5)
nRBC: 0 % (ref 0.0–0.2)

## 2022-08-20 LAB — COMPREHENSIVE METABOLIC PANEL
ALT: 13 U/L (ref 0–44)
AST: 18 U/L (ref 15–41)
Albumin: 3.9 g/dL (ref 3.5–5.0)
Alkaline Phosphatase: 83 U/L (ref 38–126)
Anion gap: 13 (ref 5–15)
BUN: 13 mg/dL (ref 6–20)
CO2: 23 mmol/L (ref 22–32)
Calcium: 9.1 mg/dL (ref 8.9–10.3)
Chloride: 99 mmol/L (ref 98–111)
Creatinine, Ser: 0.7 mg/dL (ref 0.44–1.00)
GFR, Estimated: >60 mL/min (ref >60)
Glucose, Bld: 118 mg/dL — ABNORMAL HIGH (ref 70–99)
Potassium: 3.5 mmol/L (ref 3.5–5.1)
Sodium: 135 mmol/L (ref 135–145)
Total Bilirubin: 1.2 mg/dL (ref 0.3–1.2)
Total Protein: 7.6 g/dL (ref 6.5–8.1)

## 2022-08-20 LAB — LACTIC ACID, PLASMA
Lactic Acid, Venous: 1.1 mmol/L (ref 0.5–1.9)
Lactic Acid, Venous: 0.8 mmol/L (ref 0.5–1.9)

## 2022-08-20 LAB — LIPASE, BLOOD: Lipase: 26 U/L (ref 11–51)

## 2022-08-20 MED ORDER — METHOCARBAMOL 500 MG PO TABS: 500.0000 mg | ORAL_TABLET | Freq: Three times a day (TID) | ORAL | Status: DC | PRN

## 2022-08-20 MED ORDER — IOHEXOL 300 MG/ML  SOLN
100.0000 mL | Freq: Once | INTRAMUSCULAR | Status: AC | PRN
  Administered 2022-08-20: 100 mL via INTRAVENOUS

## 2022-08-20 MED ORDER — KETOROLAC TROMETHAMINE 30 MG/ML IJ SOLN
30.0000 mg | Freq: Four times a day (QID) | INTRAMUSCULAR | Status: DC
  Administered 2022-08-20 – 2022-08-22 (×7): 30 mg via INTRAVENOUS
  Filled 2022-08-20 (×6): qty 1

## 2022-08-20 MED ORDER — HYDRALAZINE HCL 20 MG/ML IJ SOLN: 10.0000 mg | INTRAMUSCULAR | Status: DC | PRN

## 2022-08-20 MED ORDER — KETOROLAC TROMETHAMINE 30 MG/ML IJ SOLN: 30.0000 mg | Freq: Four times a day (QID) | INTRAMUSCULAR | Status: DC | PRN

## 2022-08-20 MED ORDER — PIPERACILLIN-TAZOBACTAM 3.375 G IVPB
3.3750 g | Freq: Three times a day (TID) | INTRAVENOUS | Status: DC
  Administered 2022-08-20 – 2022-08-22 (×5): 3.375 g via INTRAVENOUS
  Filled 2022-08-20 (×5): qty 50

## 2022-08-20 MED ORDER — DIPHENHYDRAMINE HCL 12.5 MG/5ML PO ELIX: 12.5000 mg | ORAL_SOLUTION | Freq: Four times a day (QID) | ORAL | Status: DC | PRN

## 2022-08-20 MED ORDER — DIPHENHYDRAMINE HCL 50 MG/ML IJ SOLN: 12.5000 mg | Freq: Four times a day (QID) | INTRAMUSCULAR | Status: DC | PRN

## 2022-08-20 MED ORDER — LACTATED RINGERS IV SOLN: INTRAVENOUS | Status: AC

## 2022-08-20 MED ORDER — MORPHINE SULFATE (PF) 2 MG/ML IV SOLN: 2.0000 mg | INTRAVENOUS | Status: DC | PRN

## 2022-08-20 MED ORDER — METHOCARBAMOL 1000 MG/10ML IJ SOLN
500.0000 mg | Freq: Three times a day (TID) | INTRAVENOUS | Status: DC | PRN
  Filled 2022-08-20: qty 5

## 2022-08-20 MED ORDER — ACETAMINOPHEN 500 MG PO TABS
1000.0000 mg | ORAL_TABLET | Freq: Four times a day (QID) | ORAL | Status: DC
  Administered 2022-08-20 – 2022-08-22 (×6): 1000 mg via ORAL
  Filled 2022-08-20 (×6): qty 2

## 2022-08-20 MED ORDER — ONDANSETRON HCL 4 MG/2ML IJ SOLN: 4.0000 mg | Freq: Four times a day (QID) | INTRAMUSCULAR | Status: DC | PRN

## 2022-08-20 MED ORDER — SODIUM CHLORIDE 0.9 % IV SOLN: INTRAVENOUS | Status: DC

## 2022-08-20 MED ORDER — OXYCODONE HCL 5 MG PO TABS: 5.0000 mg | ORAL_TABLET | ORAL | Status: DC | PRN

## 2022-08-20 MED ORDER — ENOXAPARIN SODIUM 40 MG/0.4ML IJ SOSY
40.0000 mg | PREFILLED_SYRINGE | INTRAMUSCULAR | Status: DC
  Administered 2022-08-20: 40 mg via SUBCUTANEOUS
  Filled 2022-08-20 (×2): qty 0.4

## 2022-08-20 MED ORDER — SODIUM CHLORIDE 0.9 % IV BOLUS
1000.0000 mL | Freq: Once | INTRAVENOUS | Status: AC
  Administered 2022-08-20: 1000 mL via INTRAVENOUS

## 2022-08-20 MED ORDER — ONDANSETRON 4 MG PO TBDP: 4.0000 mg | ORAL_TABLET | Freq: Four times a day (QID) | ORAL | Status: DC | PRN

## 2022-08-20 MED ORDER — PIPERACILLIN-TAZOBACTAM 3.375 G IVPB 30 MIN
3.3750 g | Freq: Once | INTRAVENOUS | Status: AC
  Administered 2022-08-20: 3.375 g via INTRAVENOUS
  Filled 2022-08-20: qty 50

## 2022-08-20 MED ORDER — ONDANSETRON HCL 4 MG/2ML IJ SOLN
4.0000 mg | Freq: Once | INTRAMUSCULAR | Status: AC
  Administered 2022-08-20: 4 mg via INTRAVENOUS
  Filled 2022-08-20: qty 2

## 2022-08-20 MED ORDER — PANTOPRAZOLE SODIUM 40 MG IV SOLR
40.0000 mg | Freq: Two times a day (BID) | INTRAVENOUS | Status: DC
  Administered 2022-08-20 – 2022-08-22 (×4): 40 mg via INTRAVENOUS
  Filled 2022-08-20 (×4): qty 10

## 2022-08-20 NOTE — H&P (Signed)
Patient ID: Dana Jarvis, female   DOB: June 06, 1965, 57 y.o.   MRN: [id number]  HPI Dana Jarvis is a 57 y.o. female seen in consultation at the request of Dr. Arnoldo Morale, case d/w her in detail . She presents with a 3-day history of abdominal pain that for started Now radiates to the right lower quadrant and stays there.  The pain is sharp moderate intensity worsening with certain movements.  She did have decreased appetite and nausea.  No vomiting.  She also reports fevers.  She works as an Airline pilot for [employer] and went this morning to their healthcare clinic.  Progressive worsening of symptoms she decided to come to the emergency room. Did have a remote history of hysterectomy 18 years ago.  She is able to perform more than 4 METS activity without any shortness of breath or chest pain.  CBC shows white count of 15.600 with a left shift CMP and the rest of the CBC is completely normal.  Did have a CT scan that I have personally reviewed showing evidence of acute appendicitis with no perforation no abscess but severe inflammatory response in the lower right lower quadrant consistent with active changes involving the terminal ileum and base of the appendix  HPI  Past Medical History:  Diagnosis Date   Arthritis    hands, feet   Complication of anesthesia    has awakened during procedures   Headache    sinus   Motion sickness    ocean ships   Seasonal allergies    Wears contact lenses     Past Surgical History:  Procedure Laterality Date   ABDOMINAL HYSTERECTOMY  2005   with bilateral salpingo-oophorectomy   BREAST BIOPSY Right 05/19/2020   Stereo Bx, X-clip, neg   BREAST BIOPSY WITH RADIO FREQUENCY LOCALIZER Right 06/21/2020   Procedure: EXCISION OF BREAST BIOPSY with Radiofrequency Tag;  Surgeon: Carolan Shiver, MD;  Location: [facility] ORS;  Service: General;  Laterality: Right;   BREAST EXCISIONAL BIOPSY Right 06/21/2020   neg   CESAREAN SECTION  1988   COLONOSCOPY WITH  PROPOFOL N/A 12/31/2016   Procedure: COLONOSCOPY WITH PROPOFOL;  Surgeon: Midge Minium, MD;  Location: [facility] END[facility]COPY;  Service: Endoscopy;  Laterality: N/A;   FRACTURE SURGERY Right 2007   ankle   HARDWARE REMOVAL Right    Ankle   TONSILLECTOMY     WRIST SURGERY Left    ganglion    Family History  Problem Relation Age of Onset   Arthritis Mother    Cancer Mother        breast   Glaucoma Mother    Breast cancer Mother 31   Cancer Father        lung   Hypertension Sister    Hypertension Brother    Breast cancer Maternal Aunt    Breast cancer Cousin    Breast cancer Cousin    Colon cancer Neg Hx    Ovarian cancer Neg Hx     Social History Social History   Tobacco Use   Smoking status: Never   Smokeless tobacco: Never  Vaping Use   Vaping Use: Never used  Substance Use Topics   Alcohol use: Yes    Alcohol/week: 21.0 standard drinks of alcohol    Types: 21 Glasses of wine per week   Drug use: No    Allergies  Allergen Reactions   Chlorhexidine Gluconate    Latex Rash   Other Rash    Chloraswab   Sulfa Antibiotics Rash  Current Facility-Administered Medications  Medication Dose Route Frequency Provider Last Rate Last Admin   lactated ringers infusion   Intravenous Continuous Mumma, Carollee Herter, MD       piperacillin-tazobactam (ZOSYN) IVPB 3.375 g  3.375 g Intravenous Once Corena Herter, MD 100 mL/hr at 08/20/22 1707 3.375 g at 08/20/22 1707   sodium chloride 0.9 % bolus 1,000 mL  1,000 mL Intravenous Once Lakai Moree, Merri Ray, MD       Current Outpatient Medications  Medication Sig Dispense Refill   albuterol (VENTOLIN HFA) 108 (90 Base) MCG/ACT inhaler Inhale 2 puffs into the lungs every 6 (six) hours as needed for wheezing or shortness of breath. (Patient not taking: Reported on 08/20/2022) 8 g 1   Bacillus Coagulans-Inulin (PROBIOTIC-PREBIOTIC) 1-250 BILLION-MG CAPS Take by mouth.     cetirizine (ZYRTEC) 10 MG tablet Take 10 mg by mouth daily.      Cholecalciferol 50 MCG (2000 UT) TBDP Take 2,000 Units by mouth daily.     fluticasone (FLONASE) 50 MCG/ACT nasal spray Place 2 sprays into both nostrils daily. 16 g 6   ibuprofen (ADVIL) 200 MG tablet Take 200 mg by mouth every 6 (six) hours as needed.     Multiple Vitamins-Minerals (MULTIVITAMIN WITH MINERALS) tablet Take 1 tablet by mouth daily.     vitamin B-12 (CYANOCOBALAMIN) 1000 MCG tablet Take 1,000 mcg by mouth daily.       Review of Systems Full ROS  was asked and was negative except for the information on the HPI  Physical Exam Blood pressure (!) 174/101, pulse (!) 109, temperature 98.7 F (37.1 C), temperature source Oral, resp. rate 18, height 5\' 2"  (1.575 m), weight 77.6 kg, SpO2 93 %. CONSTITUTIONAL: NAD. EYES: Pupils are equal, round, and Sclera are non-icteric. EARS, NOSE, MOUTH AND THROAT:  The oral mucosa is pink and moist. Hearing is intact to voice. LYMPH NODES:  Lymph nodes in the neck are normal. RESPIRATORY:  Lungs are clear. There is normal respiratory effort, with equal breath sounds bilaterally, and without pathologic use of accessory muscles. CARDIOVASCULAR: Heart is regular without murmurs, gallops, or rubs. GI: The abdomen is  soft, palpation right lower quadrant without peritonitis or rebound ,there are no palpable masses. There is no hepatosplenomegaly. There are normal bowel sounds . GU: Rectal deferred.   MUSCULOSKELETAL: Normal muscle strength and tone. No cyanosis or edema.   SKIN: Turgor is good and there are no pathologic skin lesions or ulcers. NEUROLOGIC: Motor and sensation is grossly normal. Cranial nerves are grossly intact. PSYCH:  Oriented to person, place and time. Affect is normal.  Data Reviewed  I have personally reviewed the patient's imaging, laboratory findings and medical records.    Assessment/Plan 57 year old female with 3-day history of right lower quadrant abdominal pain consistent with appendicitis and significant right lower  quadrant inflammatory process with phlegmonous changes.  Discussed with the patient in detail I do recommend admission with IV antibiotics to cool this process.  I do think if we go too early to the operating room it is likely that we may have to convert and potentially have to extend the surgery to the right colectomy. He is certainly not peritonitic not toxic and does not require emergent surgical intervention.  After extensive discussion with her and her husband and they both agreed to admission serial abdominal exams fluid resuscitation and antibiotics IV .  May need to repeat a CT scan in 48 to 72 hours depending on clinical condition. I spent 75 min in  this encounter including personally reviewing imaging studies, medical records, placing orders, counseling the patient and performing appropriate documentation  Sterling Big, MD FACS General Surgeon 08/20/2022, 5:09 PM

## 2022-08-20 NOTE — Progress Notes (Signed)
CODE SEPSIS - PHARMACY COMMUNICATION  **Broad Spectrum Antibiotics should be administered within 1 hour of Sepsis diagnosis**  Time Code Sepsis Called/Page Received: 1636  Antibiotics Ordered: Zosyn  Time of 1st antibiotic administration: 1707  Additional action taken by pharmacy: N/A  Tressie Ellis 08/20/2022  4:33 PM

## 2022-08-20 NOTE — Sepsis Progress Note (Signed)
Sepsis protocol monitored by eLink ?

## 2022-08-20 NOTE — ED Triage Notes (Addendum)
Patient c/o epi gastric pain with nausea and chills. Also reports pain in lower abdomen and also left sided sharp pain. Has taken suppositories to get relief and see if a BM would help the issue but pain is still present. Constant pain  No appetite. Denies fever  PCP concerned about ruptured appendicitis

## 2022-08-20 NOTE — ED Provider Notes (Signed)
[facility] Provider Note    Event Date/Time   First MD Initiated Contact with Patient 08/20/22 1538     (approximate)   History   Abdominal Pain   HPI  Dana Jarvis is a 57 y.o. female no significant past medical history who presents to the emergency department with abdominal pain.  On Saturday started with epigastric abdominal pain that moved periumbilical and now is in her right lower abdomen.  Initially thought she was constipated so she took a suppository, no longer constipated but with ongoing pain.  Endorses nausea with no episodes of vomiting.  Poor appetite over the past couple of days.  Denies any fever.  No blood in her stool.  No dysuria, urinary urgency or frequency.  Prior total hysterectomy.  No prior bowel obstruction.     Physical Exam   Triage Vital Signs: ED Triage Vitals [08/20/22 1404]  Enc Vitals Group     BP (!) 174/101     Pulse Rate (!) 109     Resp 18     Temp 98.7 F (37.1 C)     Temp Source Oral     SpO2 93 %     Weight 171 lb (77.6 kg)     Height 5\' 2"  (1.575 m)     Head Circumference      Peak Flow      Pain Score 3     Pain Loc      Pain Edu?      Excl. in GC?     Most recent vital signs: Vitals:   08/20/22 1404  BP: (!) 174/101  Pulse: (!) 109  Resp: 18  Temp: 98.7 F (37.1 C)  SpO2: 93%    Physical Exam Constitutional:      Appearance: She is well-developed.  HENT:     Head: Atraumatic.  Eyes:     Conjunctiva/sclera: Conjunctivae normal.  Cardiovascular:     Rate and Rhythm: Regular rhythm.  Pulmonary:     Effort: No respiratory distress.  Abdominal:     General: There is no distension.     Tenderness: There is abdominal tenderness in the right lower quadrant. There is no right CVA tenderness, left CVA tenderness, guarding or rebound. Positive signs include McBurney's sign.  Musculoskeletal:        General: Normal range of motion.     Cervical back: Normal range of motion.  Skin:     General: Skin is warm.  Neurological:     Mental Status: She is alert. Mental status is at baseline.     IMPRESSION / MDM / ASSESSMENT AND PLAN / ED COURSE  I reviewed the triage vital signs and the nursing notes.  Differential diagnosis including appendicitis, IBD, SBO, abscess, constipation, pyelonephritis, kidney stone  EKG  I, Corena Herter, the attending physician, personally viewed and interpreted this ECG.   Rate: 108  Rhythm: Sinus tachycardia  Axis: Normal  Intervals: Normal  ST&T Change: None  Sinus tachycardia while on cardiac telemetry.  RADIOLOGY I independently reviewed imaging, my interpretation of imaging: CT abdomen and pelvis with contrast findings consistent with acute appendicitis.  Discussed with radiologist, acute appendicitis with phlegmon but no obvious abscess or perforation.  LABS (all labs ordered are listed, but only abnormal results are displayed) Labs interpreted as -    Labs Reviewed  COMPREHENSIVE METABOLIC PANEL - Abnormal; Notable for the following components:      Result Value   Glucose, Bld 118 (*)  All other components within normal limits  CBC - Abnormal; Notable for the following components:   WBC 15.6 (*)    RDW 11.2 (*)    All other components within normal limits  CULTURE, BLOOD (ROUTINE X 2)  CULTURE, BLOOD (ROUTINE X 2)  LIPASE, BLOOD  URINALYSIS, ROUTINE W REFLEX MICROSCOPIC  LACTIC ACID, PLASMA  LACTIC ACID, PLASMA     MDM  Patient given IV fluids, IV Zofran.  Declined any pain medication at this time.  Made NPO.    CT scan concerning for acute appendicitis.  Given her tachycardia and leukocytosis added on blood cultures and lactic acid.  Given IV Zosyn.  Consulted general surgery and discussed with Dr. Everlene Farrier who recommended n.p.o. and will evaluate the patient in the emergency department.  Plan for admission.   PROCEDURES:  Critical Care performed: No  Procedures  Patient's presentation is most consistent  with acute presentation with potential threat to life or bodily function.   MEDICATIONS ORDERED IN ED: Medications  lactated ringers infusion (has no administration in time range)  piperacillin-tazobactam (ZOSYN) IVPB 3.375 g (has no administration in time range)  sodium chloride 0.9 % bolus 1,000 mL (1,000 mLs Intravenous New Bag/Given 08/20/22 1605)  ondansetron (ZOFRAN) injection 4 mg (4 mg Intravenous Given 08/20/22 1604)  iohexol (OMNIPAQUE) 300 MG/ML solution 100 mL (100 mLs Intravenous Contrast Given 08/20/22 1618)    FINAL CLINICAL IMPRESSION(S) / ED DIAGNOSES   Final diagnoses:  Acute appendicitis with localized peritonitis, without perforation, abscess, or gangrene     Rx / DC Orders   ED Discharge Orders     None        Note:  This document was prepared using Dragon voice recognition software and may include unintentional dictation errors.   Corena Herter, MD 08/20/22 [phone]

## 2022-08-20 NOTE — Progress Notes (Signed)
[facility] [address] [place], [place] [address]   Office Visit Note  Patient Name: Dana Jarvis Date of Birth [id number]  Medical Record number [id number]  Date of Service: 08/20/2022  Chief Complaint  Patient presents with   Abdominal Pain    ? Diverticulitis     57 y/o F presents to the clinic for c/o R lower abdominal pain x 3 days. Along with pain she also c/o chills, nausea, but no vomiting. Onset of pain sitting at a restaurant before eating. Continues to have pain in the same concerned area without any relief. Felt pain radiate to the left lower abdomen earlier today. +BM, but less than normal. Describes pain in the right lower abdomen as "catching on something". +h/o GERD, c-section, total hysterectomy, and had colonoscopy. Denies urinary symptoms ie dysuria or urinary frequency.   Abdominal Pain Associated symptoms include nausea. Pertinent negatives include no constipation, diarrhea, fever or vomiting.      Current Medication:  Outpatient Encounter Medications as of 08/20/2022  Medication Sig   Bacillus Coagulans-Inulin (PROBIOTIC-PREBIOTIC) 1-250 BILLION-MG CAPS Take by mouth.   cetirizine (ZYRTEC) 10 MG tablet Take 10 mg by mouth daily.   Cholecalciferol 50 MCG (2000 UT) TBDP Take 2,000 Units by mouth daily.   fluticasone (FLONASE) 50 MCG/ACT nasal spray Place 2 sprays into both nostrils daily.   ibuprofen (ADVIL) 200 MG tablet Take 200 mg by mouth every 6 (six) hours as needed.   Multiple Vitamins-Minerals (MULTIVITAMIN WITH MINERALS) tablet Take 1 tablet by mouth daily.   vitamin B-12 (CYANOCOBALAMIN) 1000 MCG tablet Take 1,000 mcg by mouth daily.   albuterol (VENTOLIN HFA) 108 (90 Base) MCG/ACT inhaler Inhale 2 puffs into the lungs every 6 (six) hours as needed for wheezing or shortness of breath. (Patient not taking: Reported on 08/20/2022)   [DISCONTINUED] azithromycin (ZITHROMAX) 250 MG tablet Take 2 tablets on day 1, then 1 tablet daily on days 2  through 5   [DISCONTINUED] fluconazole (DIFLUCAN) 150 MG tablet Take one tablet (150 MG) by mouth once a week as needed for skin rash if present. (Patient not taking: Reported on 11/16/2021)   [DISCONTINUED] fluticasone (FLONASE) 50 MCG/ACT nasal spray Place 1 spray into both nostrils 2 (two) times daily as needed. (Patient not taking: Reported on 11/02/2021)   [DISCONTINUED] methylPREDNISolone (MEDROL DOSEPAK) 4 MG TBPK tablet Take as directed   [DISCONTINUED] nystatin (MYCOSTATIN/NYSTOP) powder Apply 1 Application topically 3 (three) times daily. (Patient not taking: Reported on 04/22/2022)   [DISCONTINUED] predniSONE (DELTASONE) 10 MG tablet Take 6 tablets by mouth daily for 2 days, then reduce by 1 tablet every 2 days until gone (Patient not taking: Reported on 10/17/2021)   No facility-administered encounter medications on file as of 08/20/2022.      Medical History: Past Medical History:  Diagnosis Date   Arthritis    hands, feet   Complication of anesthesia    has awakened during procedures   Headache    sinus   Motion sickness    ocean ships   Seasonal allergies    Wears contact lenses      Vital Signs: BP (!) 142/89   Pulse 90   Temp 99.5 F (37.5 C) (Tympanic)   Resp 18   Wt 173 lb (78.5 kg)   LMP  (LMP Unknown)   SpO2 100%   BMI 31.64 kg/m    Review of Systems  Constitutional:  Positive for chills. Negative for fever.  Respiratory: Negative.  Cardiovascular: Negative.   Gastrointestinal:  Positive for abdominal pain (right lower) and nausea. Negative for anal bleeding, blood in stool, constipation, diarrhea, rectal pain and vomiting.    Physical Exam Constitutional:      Appearance: Normal appearance.  HENT:     Head: Normocephalic and atraumatic.     Right Ear: External ear normal.     Left Ear: External ear normal.  Eyes:     Extraocular Movements: Extraocular movements intact.  Cardiovascular:     Rate and Rhythm: Normal rate and regular rhythm.   Pulmonary:     Effort: Pulmonary effort is normal.     Breath sounds: Normal breath sounds.  Abdominal:     General: Bowel sounds are increased.     Tenderness: There is abdominal tenderness in the right lower quadrant. There is no right CVA tenderness, left CVA tenderness, guarding or rebound.  Skin:    General: Skin is warm and dry.  Neurological:     Mental Status: She is alert and oriented to person, place, and time.  Psychiatric:        Mood and Affect: Mood normal.        Behavior: Behavior normal.       Assessment/Plan:  1. Right lower quadrant abdominal pain R/o Appendicitis vs diverticulitis vs GB vs pancreatitis I reviewed my concerns with the patient.  Dr. Andrey Spearman also examined the patient.  REST with clear liquids for 24 hours Discussed with patient to seek ER or call 911 if symptoms worsen ie worsening nuasea, vomiting, fever, chills, worsening abdominal pain, etc. RTC tomorrow for a follow up.  Pt verbalized understanding and in agreement.    General Counseling: [name] verbalizes understanding of the findings of todays visit and agrees with plan of treatment. I have discussed any further diagnostic evaluation that may be needed or ordered today. We also reviewed her medications today. she has been encouraged to call the office with any questions or concerns that should arise related to todays visit.    Time spent:30 Minutes    Gilberto Better, [place] Physician Assistant

## 2022-08-21 ENCOUNTER — Encounter: Payer: Self-pay | Admitting: Surgery

## 2022-08-21 ENCOUNTER — Ambulatory Visit: Payer: Self-pay | Admitting: Physician Assistant

## 2022-08-21 DIAGNOSIS — K3580 Unspecified acute appendicitis: Secondary | ICD-10-CM | POA: Diagnosis not present

## 2022-08-21 LAB — SURGICAL PCR SCREEN
MRSA, PCR: NEGATIVE
Staphylococcus aureus: NEGATIVE

## 2022-08-21 LAB — BASIC METABOLIC PANEL
Anion gap: 10 (ref 5–15)
BUN: 10 mg/dL (ref 6–20)
CO2: 20 mmol/L — ABNORMAL LOW (ref 22–32)
Calcium: 7.9 mg/dL — ABNORMAL LOW (ref 8.9–10.3)
Chloride: 107 mmol/L (ref 98–111)
Creatinine, Ser: 0.62 mg/dL (ref 0.44–1.00)
GFR, Estimated: >60 mL/min (ref >60)
Glucose, Bld: 74 mg/dL (ref 70–99)
Potassium: 3.5 mmol/L (ref 3.5–5.1)
Sodium: 137 mmol/L (ref 135–145)

## 2022-08-21 LAB — CBC
HCT: 35.1 % — ABNORMAL LOW (ref 36.0–46.0)
Hemoglobin: 11.9 g/dL — ABNORMAL LOW (ref 12.0–15.0)
MCH: 33.6 pg (ref 26.0–34.0)
MCHC: 33.9 g/dL (ref 30.0–36.0)
MCV: 99.2 fL (ref 80.0–100.0)
Platelets: 199 10*3/uL (ref 150–400)
RBC: 3.54 MIL/uL — ABNORMAL LOW (ref 3.87–5.11)
RDW: 11.2 % — ABNORMAL LOW (ref 11.5–15.5)
WBC: 10.1 10*3/uL (ref 4.0–10.5)
nRBC: 0 % (ref 0.0–0.2)

## 2022-08-21 LAB — CULTURE, BLOOD (ROUTINE X 2): Culture: NO GROWTH

## 2022-08-21 LAB — HIV ANTIBODY (ROUTINE TESTING W REFLEX): HIV Screen 4th Generation wRfx: NONREACTIVE

## 2022-08-21 NOTE — Progress Notes (Signed)
[facility] SURGICAL PROGRESS NOTE (cpt [phone])  Hospital Day(s): 1.   Interval History: Patient seen and examined, no acute events or new complaints overnight. Patient reports she is markedly better compared to admission. Very mild RLQ discomfort occasionally but reports this "is nothing like before coming to the hospital." No fever, chills, nausea, emesis. Leukocytosis has resolved; now 10.1K. BMP is reassuring. She is NPO. Continues on Zosyn   Review of Systems:  Constitutional: denies fever, chills  HEENT: denies cough or congestion  Respiratory: denies any shortness of breath  Cardiovascular: denies chest pain or palpitations  Gastrointestinal: denies abdominal pain, N/V Genitourinary: denies burning with urination or urinary frequency Musculoskeletal: denies pain, decreased motor or sensation  Vital signs in last 24 hours: [min-max] current  Temp:  [98.3 F (36.8 C)-99.5 F (37.5 C)] 98.3 F (36.8 C) (05/22 0453) Pulse Rate:  [90-109] 98 (05/22 0411) Resp:  [16-18] 16 (05/22 0411) BP: (142-174)/(79-101) 148/79 (05/22 0411) SpO2:  [93 %-100 %] 96 % (05/22 0411) Weight:  [77.6 kg-78.5 kg] 77.6 kg (05/21 1404)     Height: 5\' 2"  (157.5 cm) Weight: 77.6 kg BMI (Calculated): 31.27   Intake/Output last 2 shifts:  05/21 0701 - 05/22 0700 In: 1100 [IV Piggyback:1100] Out: -    Physical Exam:  Constitutional: alert, cooperative and no distress  HENT: normocephalic without obvious abnormality  Eyes: PERRL, EOM's grossly intact and symmetric  Respiratory: breathing non-labored at rest  Cardiovascular: regular rate and sinus rhythm  Gastrointestinal: Sot, non-tender, non-distended, no rebound/guarding. She is certainly without peritonitis Musculoskeletal: no edema or wounds, motor and sensation grossly intact, NT    Labs:     Latest Ref Rng & Units 08/21/2022    4:50 AM 08/20/2022    2:07 PM 10/24/2021    8:35 AM  CBC  WBC 4.0 - 10.5 K/uL 10.1  15.6  5.7    Hemoglobin 12.0 - 15.0 g/dL 47.8  29.5  62.1   Hematocrit 36.0 - 46.0 % 35.1  40.8  43.7   Platelets 150 - 400 K/uL 199  262  289       Latest Ref Rng & Units 08/21/2022    4:50 AM 08/20/2022    2:07 PM 10/24/2021    8:35 AM  CMP  Glucose 70 - 99 mg/dL 74  308  657   BUN 6 - 20 mg/dL 10  13  11    Creatinine 0.44 - 1.00 mg/dL 8.46  9.62  9.52   Sodium 135 - 145 mmol/L 137  135  143   Potassium 3.5 - 5.1 mmol/L 3.5  3.5  4.4   Chloride 98 - 111 mmol/L 107  99  103   CO2 22 - 32 mmol/L 20  23  25    Calcium 8.9 - 10.3 mg/dL 7.9  9.1  9.5   Total Protein 6.5 - 8.1 g/dL  7.6  6.8   Total Bilirubin 0.3 - 1.2 mg/dL  1.2  0.4   Alkaline Phos 38 - 126 U/L  83  91   AST 15 - 41 U/L  18  18   ALT 0 - 44 U/L  13  15      Imaging studies: No new pertinent imaging studies   Assessment/Plan: (ICD-10's: K35.30) 57 y.o. female with acute appendicitis with phlegmon   - I think it is reasonable to advance to [facility] this morning  - Continue IV Abx (Zosyn)  - No emergent surgical intervention; goal is to avoid needing more  extensive procedure (ie: right hemicolectomy). Will need interval appendectomy in 6-8 weeks.   - Consider repeat CT in 24-48 hours   - Monitor abdominal examination; on-going bowel function   - Pain control prn; antiemetics prn   - Monitor leukocytosis; resolved - Mobilize  - Discharge Planning; Doing well, may be able to DC home in 24-48 hours +/- repeat imaging prior  All of the above findings and recommendations were discussed with the patient, and the medical team, and all of patient's questions were answered to her expressed satisfaction.  -- Lynden Oxford, PA-C [facility] 08/21/2022, 7:24 AM M-F: 7am - 4pm

## 2022-08-21 NOTE — Plan of Care (Signed)
Patient A&Ox4, from home, independent in room. No c/o pain. IV abx continued.

## 2022-08-22 DIAGNOSIS — K3580 Unspecified acute appendicitis: Secondary | ICD-10-CM | POA: Diagnosis not present

## 2022-08-22 LAB — CBC
HCT: 32.3 % — ABNORMAL LOW (ref 36.0–46.0)
Hemoglobin: 11.2 g/dL — ABNORMAL LOW (ref 12.0–15.0)
MCH: 33.5 pg (ref 26.0–34.0)
MCHC: 34.7 g/dL (ref 30.0–36.0)
MCV: 96.7 fL (ref 80.0–100.0)
Platelets: 210 10*3/uL (ref 150–400)
RBC: 3.34 MIL/uL — ABNORMAL LOW (ref 3.87–5.11)
RDW: 11.3 % — ABNORMAL LOW (ref 11.5–15.5)
WBC: 7.2 10*3/uL (ref 4.0–10.5)
nRBC: 0 % (ref 0.0–0.2)

## 2022-08-22 LAB — CULTURE, BLOOD (ROUTINE X 2): Special Requests: ADEQUATE

## 2022-08-22 MED ORDER — AMOXICILLIN-POT CLAVULANATE 875-125 MG PO TABS: 1.0000 | ORAL_TABLET | Freq: Two times a day (BID) | ORAL | 0 refills | Status: AC

## 2022-08-22 NOTE — Discharge Summary (Signed)
[facility] SURGICAL DISCHARGE SUMMARY (cpt: [phone])  Patient ID: Dana Jarvis MRN: [id number] DOB/AGE: 07-01-1965 57 y.o.  Admit date: 08/20/2022 Discharge date: 08/22/2022  Discharge Diagnoses Patient Active Problem List   Diagnosis Date Noted   Acute appendicitis with localized peritonitis, without perforation, abscess, or gangrene 08/20/2022   Appendicitis 08/20/2022    Consultants None  Procedures None  HPI / Hospital Course: Dana Jarvis is a 57 y.o. female presenting to the ED on 05/21 secondary to abdominal pain. Work up was concerning for appendicitis with significant phlegmon. She was admitted to general surgery and underwent conservative management. On hospital day 1, her pain significantly improved and leukocytosis resolved.  Advancement of patient's diet and ambulation were well-tolerated. The remainder of patient's hospital course was essentially unremarkable, and discharge planning was initiated accordingly with patient safely able to be discharged home with appropriate discharge instructions, antibiotics (Augmentin x8 days), pain control, and outpatient follow-up after all of her questions were answered to her expressed satisfaction.   Discharge Condition: Good   Physical Examination:  Constitutional: alert, cooperative and no distress  HENT: normocephalic without obvious abnormality  Eyes: PERRL, EOM's grossly intact and symmetric  Respiratory: breathing non-labored at rest  Cardiovascular: regular rate and sinus rhythm  Gastrointestinal: Sot, non-tender, non-distended, no rebound/guarding. She is certainly without peritonitis Musculoskeletal: no edema or wounds, motor and sensation grossly intact, NT    Allergies as of 08/22/2022       Reactions   Chlorhexidine Gluconate Rash   Latex Rash   Other Rash   Chloraswab   Sulfa Antibiotics Rash        Medication List     TAKE these medications    albuterol 108 (90 Base) MCG/ACT  inhaler Commonly known as: VENTOLIN HFA Inhale 2 puffs into the lungs every 6 (six) hours as needed for wheezing or shortness of breath.   amoxicillin-clavulanate 875-125 MG tablet Commonly known as: AUGMENTIN Take 1 tablet by mouth 2 (two) times daily for 8 days.   cetirizine 10 MG tablet Commonly known as: ZYRTEC Take 10 mg by mouth daily.   Cholecalciferol 50 MCG (2000 UT) Tbdp Take 2,000 Units by mouth daily.   cyanocobalamin 1000 MCG tablet Commonly known as: VITAMIN B12 Take 1,000 mcg by mouth daily.   fluticasone 50 MCG/ACT nasal spray Commonly known as: FLONASE Place 2 sprays into both nostrils daily.   ibuprofen 200 MG tablet Commonly known as: ADVIL Take 200 mg by mouth every 6 (six) hours as needed.   multivitamin with minerals tablet Take 1 tablet by mouth daily.   Probiotic-Prebiotic 1-250 BILLION-MG Caps Take by mouth.          Follow-up Information     Pabon, [place] F, MD. Schedule an appointment as soon as possible for a visit in 4 week(s).   Specialty: General Surgery Why: Hospital follow up; appendicitis - discuss surgery Contact information: [address] [place] [address] [phone]                  Time spent on discharge management including discussion of hospital course, clinical condition, outpatient instructions, prescriptions, and follow up with the patient and members of the medical team: >30 minutes  -- Lynden Oxford , PA-C [facility]  08/22/2022, 8:47 AM [phone] M-F: 7am - 4pm

## 2022-08-23 ENCOUNTER — Telehealth: Payer: Self-pay

## 2022-08-23 LAB — CULTURE, BLOOD (ROUTINE X 2): Special Requests: ADEQUATE

## 2022-08-23 NOTE — Transitions of Care (Post Inpatient/ED Visit) (Signed)
   08/23/2022  Name: ALOHA Jarvis MRN: [id number] DOB: Sep 18, 1965  Today's TOC FU Call Status: Today's TOC FU Call Status:: Successful TOC FU Call Competed TOC FU Call Complete Date: 08/23/22  Transition Care Management Follow-up Telephone Call Date of Discharge: 08/22/22 Discharge Facility: [facility]) Type of Discharge: Inpatient Admission Primary Inpatient Discharge Diagnosis:: Acute Appendicitis How have you been since you were released from the hospital?: Better Any questions or concerns?: No  Items Reviewed: Did you receive and understand the discharge instructions provided?: Yes Medications obtained,verified, and reconciled?: Yes (Medications Reviewed) Any new allergies since your discharge?: No Dietary orders reviewed?: No Do you have support at home?: Yes People in Home: sibling(s) Name of Support/Comfort Primary Source: Rhonda  Medications Reviewed Today: Medications Reviewed Today     Reviewed by Jodelle Gross, RN (Case Manager) on 08/23/22 at 1542  Med List Status: <None>   Medication Order Taking? Sig Documenting Provider Last Dose Status Informant  albuterol (VENTOLIN HFA) 108 (90 Base) MCG/ACT inhaler [id number]  Inhale 2 puffs into the lungs every 6 (six) hours as needed for wheezing or shortness of breath.  Patient not taking: Reported on 08/20/2022   Gilberto Better, PA-C  Active Self  amoxicillin-clavulanate (AUGMENTIN) 875-125 MG tablet [id number] Yes Take 1 tablet by mouth 2 (two) times daily for 8 days. Donovan Kail, PA-C Taking Active   Bacillus Coagulans-Inulin (PROBIOTIC-PREBIOTIC) 1-250 BILLION-MG CAPS [id number]  Take by mouth. [provider]  Active Self  cetirizine (ZYRTEC) 10 MG tablet [id number]  Take 10 mg by mouth daily. [provider]  Active Self  Cholecalciferol 50 MCG (2000 UT) TBDP [id number]  Take 2,000 Units by mouth daily. [provider]  Active Self  fluticasone (FLONASE) 50  MCG/ACT nasal spray [id number]  Place 2 sprays into both nostrils daily.  Patient not taking: Reported on 08/20/2022   Viviano Simas, FNP  Active Self  ibuprofen (ADVIL) 200 MG tablet [id number]  Take 200 mg by mouth every 6 (six) hours as needed. [provider]  Active Self  Multiple Vitamins-Minerals (MULTIVITAMIN WITH MINERALS) tablet [id number]  Take 1 tablet by mouth daily. [provider]  Active Self  vitamin B-12 (CYANOCOBALAMIN) 1000 MCG tablet [id number]  Take 1,000 mcg by mouth daily. [provider]  Active Self            Home Care and Equipment/Supplies: Were Home Health Services Ordered?: No Any new equipment or medical supplies ordered?: No  Functional Questionnaire: Do you need assistance with bathing/showering or dressing?: No Do you need assistance with meal preparation?: No Do you need assistance with eating?: No Do you have difficulty maintaining continence: No Do you need assistance with getting out of bed/getting out of a chair/moving?: No Do you have difficulty managing or taking your medications?: No  Follow up appointments reviewed: PCP Follow-up appointment confirmed?: NA Specialist Hospital Follow-up appointment confirmed?: Yes Date of Specialist follow-up appointment?: 09/23/22 Follow-Up Specialty Provider:: Dr. Everlene Farrier (surgeon) Do you need transportation to your follow-up appointment?: No Do you understand care options if your condition(s) worsen?: Yes-patient verbalized understanding  SDOH Interventions Today    Flowsheet Row Most Recent Value  SDOH Interventions   Food Insecurity Interventions Intervention Not Indicated  Transportation Interventions Intervention Not Indicated  Utilities Interventions Intervention Not Indicated     Jodelle Gross, RN, BSN, CCM Care Management Coordinator [facility] Phone: [phone]/Fax: [phone]

## 2022-08-25 LAB — CULTURE, BLOOD (ROUTINE X 2): Culture: NO GROWTH

## 2022-09-23 ENCOUNTER — Ambulatory Visit (INDEPENDENT_AMBULATORY_CARE_PROVIDER_SITE_OTHER): Payer: BC Managed Care – PPO | Admitting: Surgery

## 2022-09-23 ENCOUNTER — Encounter: Payer: Self-pay | Admitting: Surgery

## 2022-09-23 VITALS — BP 145/80 | HR 61 | Temp 98.1°F | Ht 62.0 in | Wt 172.6 lb

## 2022-09-23 DIAGNOSIS — W25XXXA Contact with sharp glass, initial encounter: Secondary | ICD-10-CM | POA: Diagnosis not present

## 2022-09-23 DIAGNOSIS — K353 Acute appendicitis with localized peritonitis, without perforation or gangrene: Secondary | ICD-10-CM

## 2022-09-23 DIAGNOSIS — Z743 Need for continuous supervision: Secondary | ICD-10-CM | POA: Diagnosis not present

## 2022-09-23 DIAGNOSIS — S61419A Laceration without foreign body of unspecified hand, initial encounter: Secondary | ICD-10-CM | POA: Diagnosis not present

## 2022-09-23 DIAGNOSIS — W010XXA Fall on same level from slipping, tripping and stumbling without subsequent striking against object, initial encounter: Secondary | ICD-10-CM | POA: Diagnosis not present

## 2022-09-23 NOTE — Patient Instructions (Signed)
Our surgery scheduler Britta Mccreedy will call you within 24-48 hours to get you scheduled. If you have not heard from her after 48 hours, please call our office. Have the blue sheet available when she calls to write down important information.   If you have any concerns or questions, please feel free to call our office.   Laparoscopic Appendectomy, Adult  A laparoscopic appendectomy is a surgery to take out the appendix. The appendix is a finger-like structure that is attached to the large intestine. This procedure may be done to prevent an inflamed appendix from bursting (rupturing). It may also be done to treat the infection from an appendix that has ruptured. It is often done right after appendicitis is diagnosed. Appendicitis is inflammation of the appendix. In this surgery, your health care provider uses a thin, lighted tube with a camera (laparoscope) to take out the appendix through three small incisions. This is a minimally invasive surgery. It usually results in less pain, fewer problems, and a quicker recovery than surgery done through a large incision (open appendectomy). Tell a health care provider about: Any allergies you have. All medicines you are taking, including vitamins, herbs, eye drops, creams, and over-the-counter medicines. Any steroid use. This includes creams or steroids you take by mouth. Any problems you or family members have had with anesthetic medicines. Any bleeding problems you have. Any surgeries you have had. Any medical conditions you have. Whether you are pregnant or may be pregnant. What are the risks? Generally, this is a safe procedure. However, problems may occur, including: Infection. Bleeding. Damage to nearby structures or organs. Allergic reactions to medicines. A collection of pus (abscess). Blood clots in the legs. What happens before the procedure? When to stop eating and drinking Follow instructions from your health care provider about eating and  drinking restrictions. You may be asked not to eat or drink as soon as the diagnosis of appendicitis is made. Medicines Ask your health care provider about: Changing or stopping your regular medicines. This is especially important if you are taking diabetes medicines or blood thinners. Taking medicines such as aspirin and ibuprofen. These medicines can thin your blood. Do not take these medicines unless your health care provider tells you to take them. Taking over-the-counter medicines, vitamins, herbs, and supplements. General instructions If you will be going home right after the procedure, plan to have a responsible adult: Take you home from the hospital or clinic. You will not be allowed to drive. Care for you for the time you are told. Ask your health care provider: How your surgery site will be marked. What steps will be taken to help prevent infection. These steps may include: Removing hair at the surgery site. Washing skin with a germ-killing soap. Taking antibiotic medicine. What happens during the procedure?  An IV will be inserted into one of your veins. You will be given one or more of the following: A medicine to help you relax (sedative). A medicine to numb the area (local anesthetic). A medicine to make you fall asleep (general anesthetic). A thin, flexible tube (catheter) may be put into your bladder to drain urine. A tube may be passed through your nose or mouth and into your stomach (orogastric or nasogastric tube) to drain any stomach contents. Your surgeon will make three small incisions near your belly button (navel). A gas (carbon dioxide) will be used to fill your abdomen. The gas will make your abdomen expand. This helps the surgeon see clearly and gives him  or her more room to work. A laparoscope will be passed through one of the incisions. Other surgical instruments will be passed through the other incisions to assist in surgery. The appendix will be located and  removed through one of the incisions. The abdomen may be washed out to remove bacteria. The incisions will be closed with stitches (sutures), staples, or adhesive strips. A bandage (dressing) may be used to cover the incisions. If a tube was inserted into your bladder or stomach, it will be removed. The procedure may vary among health care providers and hospitals. What happens after the procedure? Your blood pressure, heart rate, breathing rate, and blood oxygen level will be monitored until you leave the hospital or clinic. You will be given medicines as needed to control pain and infection. If you were given a sedative during the procedure, it can affect you for several hours. Do not drive or operate machinery until your health care provider says that it is safe. If your appendix did not rupture, you may be able to go home the same day after your surgery. If your appendix ruptured: You will get antibiotic medicine through an IV line. You may be sent home with a temporary drain. Summary A laparoscopic appendectomy is a surgery to take out the appendix. The appendix is removed through three small incisions with the help of a thin, lighted tube that has a camera (laparoscope). This is a safe procedure, but there are some risks. Risks include bleeding, infection, allergic reaction to medicines, or damage to nearby organs. After the procedure, your blood pressure, heart rate, breathing rate, and blood oxygen level will be monitored until you leave the hospital or clinic. You will be given medicines as needed to control pain and infection. This information is not intended to replace advice given to you by your health care provider. Make sure you discuss any questions you have with your health care provider. Document Revised: 12/28/2020 Document Reviewed: 12/28/2020 Elsevier Patient Education  2024 [employer].

## 2022-09-23 NOTE — H&P (View-Only) (Signed)
Outpatient Surgical Follow Up  09/23/2022  Dana Jarvis is an 57 y.o. female.   Chief Complaint  Patient presents with   Hospitalization Follow-up    Appendicitis     HPI: Is a 57 year old female with a recent hospitalization for perforated appendicitis with significant amatory response.  She was appropriately treated and successfully treated with antibiotics and she did very well.  She now reports resolution of symptoms.  No fevers no chills.  She is tolerating diet.  She is back to work. Have personally reviewed the CT scan from last month showing evidence of appendicitis with phlegmon  Past Medical History:  Diagnosis Date   Arthritis    hands, feet   Complication of anesthesia    has awakened during procedures   Headache    sinus   Motion sickness    ocean ships   Seasonal allergies    Wears contact lenses     Past Surgical History:  Procedure Laterality Date   ABDOMINAL HYSTERECTOMY  2005   with bilateral salpingo-oophorectomy   BREAST BIOPSY Right 05/19/2020   Stereo Bx, X-clip, neg   BREAST BIOPSY WITH RADIO FREQUENCY LOCALIZER Right 06/21/2020   Procedure: EXCISION OF BREAST BIOPSY with Radiofrequency Tag;  Surgeon: Carolan Shiver, MD;  Location: [facility] ORS;  Service: General;  Laterality: Right;   BREAST EXCISIONAL BIOPSY Right 06/21/2020   neg   CESAREAN SECTION  1988   COLONOSCOPY WITH PROPOFOL N/A 12/31/2016   Procedure: COLONOSCOPY WITH PROPOFOL;  Surgeon: Midge Minium, MD;  Location: [facility] ENDOSCOPY;  Service: Endoscopy;  Laterality: N/A;   FRACTURE SURGERY Right 2007   ankle   HARDWARE REMOVAL Right    Ankle   TONSILLECTOMY     WRIST SURGERY Left    ganglion    Family History  Problem Relation Age of Onset   Arthritis Mother    Cancer Mother        breast   Glaucoma Mother    Breast cancer Mother 82   Cancer Father        lung   Hypertension Sister    Hypertension Brother    Breast cancer Maternal Aunt    Breast cancer Cousin     Breast cancer Cousin    Colon cancer Neg Hx    Ovarian cancer Neg Hx     Social History:  reports that she has never smoked. She has never used smokeless tobacco. She reports current alcohol use of about 21.0 standard drinks of alcohol per week. She reports that she does not use drugs.  Allergies:  Allergies  Allergen Reactions   Chlorhexidine Gluconate Rash   Latex Rash   Other Rash    Chloraswab   Sulfa Antibiotics Rash    Medications reviewed.    ROS Full ROS performed and is otherwise negative other than what is stated in HPI   BP (!) 145/80   Pulse 61   Temp 98.1 F (36.7 C) (Oral)   Ht 5\' 2"  (1.575 m)   Wt 172 lb 9.6 oz (78.3 kg)   LMP  (LMP Unknown)   SpO2 97%   BMI 31.57 kg/m   Physical Exam Vitals and nursing note reviewed.  Constitutional:      General: She is not in acute distress.    Appearance: Normal appearance. She is not ill-appearing.  Neck:     Vascular: No carotid bruit.  Cardiovascular:     Rate and Rhythm: Normal rate and regular rhythm.     Heart sounds:  No murmur heard.    No friction rub.  Pulmonary:     Breath sounds: Normal breath sounds. No stridor. No wheezing or rhonchi.  Abdominal:     General: Abdomen is flat. There is no distension.     Palpations: Abdomen is soft. There is no mass.     Tenderness: There is no abdominal tenderness. There is no guarding or rebound.     Hernia: No hernia is present.  Musculoskeletal:        General: Normal range of motion.     Cervical back: Normal range of motion and neck supple. No rigidity or tenderness.  Skin:    General: Skin is warm and dry.     Capillary Refill: Capillary refill takes less than 2 seconds.     Coloration: Skin is not jaundiced.  Neurological:     General: No focal deficit present.     Mental Status: She is alert and oriented to person, place, and time.     Cranial Nerves: No cranial nerve deficit.     Sensory: No sensory deficit.     Motor: No weakness.      Coordination: Coordination normal.  Psychiatric:        Mood and Affect: Mood normal.        Behavior: Behavior normal.        Thought Content: Thought content normal.        Judgment: Judgment normal.       Assessment/Plan: History of appendicitis now resolved.  To prevent any future attacks and recurrences discussed with the patient detail about appendectomy.  She is in agreement The risks, benefits, complications, treatment options, and expected outcomes were discussed with the patient.  Also discussed Laparoscopic Appendectomy.  The possibilities of  bleeding, recurrent infection, perforation of viscus, finding a normal appendix, the need for additional procedures, failure to diagnose a condition, conversion to open procedure and creating a complication requiring transfusion or further operations were discussed. The patient was given the opportunity to ask questions and have them answered.  Patient would like to proceed with Laparoscopic Appendectomy .  We Will schedule it for the second week of July to allow a full 7 week interval. I spent 40 minutes in this encounter including personally reviewing imaging studies, records, counseling the patient, placing orders and performing appropriate documentation   Sterling Big, MD [facility] General Surgeon

## 2022-09-23 NOTE — Progress Notes (Signed)
Outpatient Surgical Follow Up  09/23/2022  Dana Jarvis is an 57 y.o. female.   Chief Complaint  Patient presents with   Hospitalization Follow-up    Appendicitis     HPI: Is a 57 year old female with a recent hospitalization for perforated appendicitis with significant amatory response.  She was appropriately treated and successfully treated with antibiotics and she did very well.  She now reports resolution of symptoms.  No fevers no chills.  She is tolerating diet.  She is back to work. Have personally reviewed the CT scan from last month showing evidence of appendicitis with phlegmon  Past Medical History:  Diagnosis Date   Arthritis    hands, feet   Complication of anesthesia    has awakened during procedures   Headache    sinus   Motion sickness    ocean ships   Seasonal allergies    Wears contact lenses     Past Surgical History:  Procedure Laterality Date   ABDOMINAL HYSTERECTOMY  2005   with bilateral salpingo-oophorectomy   BREAST BIOPSY Right 05/19/2020   Stereo Bx, X-clip, neg   BREAST BIOPSY WITH RADIO FREQUENCY LOCALIZER Right 06/21/2020   Procedure: EXCISION OF BREAST BIOPSY with Radiofrequency Tag;  Surgeon: Carolan Shiver, MD;  Location: [facility] ORS;  Service: General;  Laterality: Right;   BREAST EXCISIONAL BIOPSY Right 06/21/2020   neg   CESAREAN SECTION  1988   COLONOSCOPY WITH PROPOFOL N/A 12/31/2016   Procedure: COLONOSCOPY WITH PROPOFOL;  Surgeon: Midge Minium, MD;  Location: [facility];  Service: Endoscopy;  Laterality: N/A;   FRACTURE SURGERY Right 2007   ankle   HARDWARE REMOVAL Right    Ankle   TONSILLECTOMY     WRIST SURGERY Left    ganglion    Family History  Problem Relation Age of Onset   Arthritis Mother    Cancer Mother        breast   Glaucoma Mother    Breast cancer Mother 82   Cancer Father        lung   Hypertension Sister    Hypertension Brother    Breast cancer Maternal Aunt    Breast cancer Cousin     Breast cancer Cousin    Colon cancer Neg Hx    Ovarian cancer Neg Hx     Social History:  reports that she has never smoked. She has never used smokeless tobacco. She reports current alcohol use of about 21.0 standard drinks of alcohol per week. She reports that she does not use drugs.  Allergies:  Allergies  Allergen Reactions   Chlorhexidine Gluconate Rash   Latex Rash   Other Rash    Chloraswab   Sulfa Antibiotics Rash    Medications reviewed.    ROS Full ROS performed and is otherwise negative other than what is stated in HPI   BP (!) 145/80   Pulse 61   Temp 98.1 F (36.7 C) (Oral)   Ht 5\' 2"  (1.575 m)   Wt 172 lb 9.6 oz (78.3 kg)   LMP  (LMP Unknown)   SpO2 97%   BMI 31.57 kg/m   Physical Exam Vitals and nursing note reviewed.  Constitutional:      General: She is not in acute distress.    Appearance: Normal appearance. She is not ill-appearing.  Neck:     Vascular: No carotid bruit.  Cardiovascular:     Rate and Rhythm: Normal rate and regular rhythm.     Heart sounds:  No murmur heard.    No friction rub.  Pulmonary:     Breath sounds: Normal breath sounds. No stridor. No wheezing or rhonchi.  Abdominal:     General: Abdomen is flat. There is no distension.     Palpations: Abdomen is soft. There is no mass.     Tenderness: There is no abdominal tenderness. There is no guarding or rebound.     Hernia: No hernia is present.  Musculoskeletal:        General: Normal range of motion.     Cervical back: Normal range of motion and neck supple. No rigidity or tenderness.  Skin:    General: Skin is warm and dry.     Capillary Refill: Capillary refill takes less than 2 seconds.     Coloration: Skin is not jaundiced.  Neurological:     General: No focal deficit present.     Mental Status: She is alert and oriented to person, place, and time.     Cranial Nerves: No cranial nerve deficit.     Sensory: No sensory deficit.     Motor: No weakness.      Coordination: Coordination normal.  Psychiatric:        Mood and Affect: Mood normal.        Behavior: Behavior normal.        Thought Content: Thought content normal.        Judgment: Judgment normal.       Assessment/Plan: History of appendicitis now resolved.  To prevent any future attacks and recurrences discussed with the patient detail about appendectomy.  She is in agreement The risks, benefits, complications, treatment options, and expected outcomes were discussed with the patient.  Also discussed Laparoscopic Appendectomy.  The possibilities of  bleeding, recurrent infection, perforation of viscus, finding a normal appendix, the need for additional procedures, failure to diagnose a condition, conversion to open procedure and creating a complication requiring transfusion or further operations were discussed. The patient was given the opportunity to ask questions and have them answered.  Patient would like to proceed with Laparoscopic Appendectomy .  We Will schedule it for the second week of July to allow a full 7 week interval. I spent 40 minutes in this encounter including personally reviewing imaging studies, records, counseling the patient, placing orders and performing appropriate documentation   Sterling Big, MD [facility] General Surgeon

## 2022-09-24 ENCOUNTER — Telehealth: Payer: Self-pay | Admitting: Surgery

## 2022-09-24 NOTE — Telephone Encounter (Signed)
Patient has been advised of Pre-Admission date/time, and Surgery date at [facility].  Surgery Date: 10/17/22 Preadmission Testing Date: 10/08/22 (phone 1p-4p)  Patient has been made aware to call [phone], between 1-3:00pm the day before surgery, to find out what time to arrive for surgery.

## 2022-10-08 ENCOUNTER — Encounter
Admission: RE | Admit: 2022-10-08 | Discharge: 2022-10-08 | Disposition: A | Discharge status: Home / Self Care | Payer: BC Managed Care – PPO | Source: Ambulatory Visit | Attending: Surgery | Admitting: Surgery

## 2022-10-08 HISTORY — DX: Personal history of other diseases of the digestive system: Z87.19

## 2022-10-08 NOTE — Patient Instructions (Addendum)
Your procedure is scheduled on: Thursday, July 18 Report to the Registration Desk on the 1st floor of the [facility] To find out your arrival time, please call [phone] between 1PM - 3PM on: Wednesday, July 17 If your arrival time is 6:00 am, do not arrive before that time as the [facility] entrance doors do not open until 6:00 am.  REMEMBER: Instructions that are not followed completely may result in serious medical risk, up to and including death; or upon the discretion of your surgeon and anesthesiologist your surgery may need to be rescheduled.  Do not eat food after midnight the night before surgery.  No gum chewing or hard candies.  You may however, drink CLEAR liquids up to 2 hours before you are scheduled to arrive for your surgery. Do not drink anything within 2 hours of your scheduled arrival time.  Clear liquids include: - water  - apple juice without pulp - gatorade (not RED colors) - black coffee or tea (Do NOT add milk or creamers to the coffee or tea) Do NOT drink anything that is not on this list.  One week prior to surgery: starting July 11 Stop Anti-inflammatories (NSAIDS) such as Advil, Aleve, Ibuprofen, Motrin, Naproxen, Naprosyn and Aspirin based products such as Excedrin, Goody's Powder, BC Powder. Stop ANY OVER THE COUNTER supplements until after surgery. Stop probiotic, vitamin D, multiple vitamins, vitamin B 12. You may however, continue to take Tylenol if needed for pain up until the day of surgery.  DO NOT TAKE ANY MEDICATIONS THE MORNING OF SURGERY   No Alcohol for 24 hours before or after surgery.  No Smoking including e-cigarettes for 24 hours before surgery.  No chewable tobacco products for at least 6 hours before surgery.  No nicotine patches on the day of surgery.  Do not use any "recreational" drugs for at least a week (preferably 2 weeks) before your surgery.  Please be advised that the combination of cocaine and anesthesia may have  negative outcomes, up to and including death. If you test positive for cocaine, your surgery will be cancelled.  On the morning of surgery brush your teeth with toothpaste and water, you may rinse your mouth with mouthwash if you wish. Do not swallow any toothpaste or mouthwash.  SHOWER USING ANTIBACTERIAL SOAP SUCH AS DIAL BEFORE COMING TO THE HOSPITAL ON THE DAY OF SURGERY TO HELP PREVENT INFECTION.  Do not wear jewelry, make-up, hairpins, clips or nail polish.  Do not wear lotions, powders, or perfumes.   Do not shave body hair from the neck down 48 hours before surgery.  Contact lenses, hearing aids and dentures may not be worn into surgery.  Do not bring valuables to the hospital. [facility] is not responsible for any missing/lost belongings or valuables.   Notify your doctor if there is any change in your medical condition (cold, fever, infection).  Wear comfortable clothing (specific to your surgery type) to the hospital.  After surgery, you can help prevent lung complications by doing breathing exercises.  Take deep breaths and cough every 1-2 hours.  When coughing or sneezing, hold a pillow firmly against your incision with both hands. This is called "splinting." Doing this helps protect your incision. It also decreases belly discomfort.  If you are being discharged the day of surgery, you will not be allowed to drive home. You will need a responsible individual to drive you home and stay with you for 24 hours after surgery.   If you  are taking public transportation, you will need to have a responsible individual with you.  Please call the Pre-admissions Testing Dept. at [phone] if you have any questions about these instructions.  Surgery Visitation Policy:  Patients having surgery or a procedure may have two visitors.  Children under the age of 67 must have an adult with them who is not the patient.

## 2022-10-10 ENCOUNTER — Other Ambulatory Visit: Payer: Self-pay | Admitting: Nurse Practitioner

## 2022-10-10 DIAGNOSIS — Z1231 Encounter for screening mammogram for malignant neoplasm of breast: Secondary | ICD-10-CM

## 2022-10-16 ENCOUNTER — Telehealth: Payer: Self-pay | Admitting: *Deleted

## 2022-10-16 NOTE — Telephone Encounter (Signed)
Patient called and is having surgery tomorrow with Dr. Everlene Farrier appendectomy and she would like a return to work note faxed to her employer, [employer] [phone] stating that she can return to work on 10/24/22 with restrictions.

## 2022-10-17 ENCOUNTER — Ambulatory Visit
Admission: RE | Admit: 2022-10-17 | Discharge: 2022-10-17 | Disposition: A | Discharge status: Home / Self Care | Payer: BC Managed Care – PPO | Attending: Surgery | Admitting: Surgery

## 2022-10-17 ENCOUNTER — Other Ambulatory Visit: Payer: Self-pay

## 2022-10-17 ENCOUNTER — Encounter: Payer: Self-pay | Admitting: Surgery

## 2022-10-17 ENCOUNTER — Encounter
Admission: RE | Disposition: A | Discharge status: Home / Self Care | Payer: Self-pay | Source: Home / Self Care | Attending: Surgery

## 2022-10-17 ENCOUNTER — Ambulatory Visit: Payer: BC Managed Care – PPO | Admitting: Certified Registered"

## 2022-10-17 DIAGNOSIS — K36 Other appendicitis: Secondary | ICD-10-CM | POA: Diagnosis not present

## 2022-10-17 DIAGNOSIS — K3533 Acute appendicitis with perforation and localized peritonitis, with abscess: Secondary | ICD-10-CM | POA: Diagnosis not present

## 2022-10-17 DIAGNOSIS — K353 Acute appendicitis with localized peritonitis, without perforation or gangrene: Secondary | ICD-10-CM

## 2022-10-17 DIAGNOSIS — K37 Unspecified appendicitis: Secondary | ICD-10-CM | POA: Diagnosis not present

## 2022-10-17 HISTORY — PX: LAPAROSCOPIC APPENDECTOMY: SHX408

## 2022-10-17 SURGERY — APPENDECTOMY, LAPAROSCOPIC
Anesthesia: General | Site: Abdomen

## 2022-10-17 MED ORDER — FENTANYL CITRATE (PF) 100 MCG/2ML IJ SOLN
INTRAMUSCULAR | Status: DC | PRN
  Administered 2022-10-17: 50 ug via INTRAVENOUS
  Administered 2022-10-17: 75 ug via INTRAVENOUS
  Administered 2022-10-17: 50 ug via INTRAVENOUS
  Administered 2022-10-17: 25 ug via INTRAVENOUS

## 2022-10-17 MED ORDER — KETOROLAC TROMETHAMINE 30 MG/ML IJ SOLN
30.0000 mg | Freq: Once | INTRAMUSCULAR | Status: AC
  Administered 2022-10-17: 30 mg via INTRAVENOUS

## 2022-10-17 MED ORDER — SODIUM CHLORIDE 0.9 % IR SOLN
Status: DC | PRN
  Administered 2022-10-17: 1

## 2022-10-17 MED ORDER — CELECOXIB 200 MG PO CAPS
ORAL_CAPSULE | ORAL | Status: AC
  Filled 2022-10-17: qty 1

## 2022-10-17 MED ORDER — FENTANYL CITRATE (PF) 100 MCG/2ML IJ SOLN
INTRAMUSCULAR | Status: AC
  Filled 2022-10-17: qty 2

## 2022-10-17 MED ORDER — CELECOXIB 200 MG PO CAPS
200.0000 mg | ORAL_CAPSULE | ORAL | Status: AC
  Administered 2022-10-17: 200 mg via ORAL

## 2022-10-17 MED ORDER — FAMOTIDINE 20 MG PO TABS
ORAL_TABLET | ORAL | Status: AC
  Filled 2022-10-17: qty 1

## 2022-10-17 MED ORDER — LACTATED RINGERS IV SOLN: INTRAVENOUS | Status: DC

## 2022-10-17 MED ORDER — PROPOFOL 10 MG/ML IV BOLUS
INTRAVENOUS | Status: AC
  Filled 2022-10-17: qty 20

## 2022-10-17 MED ORDER — PROPOFOL 10 MG/ML IV BOLUS
INTRAVENOUS | Status: DC | PRN
Start: 2022-10-17 — End: 2022-10-17
  Administered 2022-10-17: 200 mg via INTRAVENOUS

## 2022-10-17 MED ORDER — DEXAMETHASONE SODIUM PHOSPHATE 10 MG/ML IJ SOLN
INTRAMUSCULAR | Status: AC
  Filled 2022-10-17: qty 1

## 2022-10-17 MED ORDER — LIDOCAINE HCL (PF) 2 % IJ SOLN
INTRAMUSCULAR | Status: AC
  Filled 2022-10-17: qty 5

## 2022-10-17 MED ORDER — OXYCODONE HCL 5 MG/5ML PO SOLN: 5.0000 mg | Freq: Once | ORAL | Status: DC | PRN

## 2022-10-17 MED ORDER — ACETAMINOPHEN 500 MG PO TABS
1000.0000 mg | ORAL_TABLET | ORAL | Status: AC
  Administered 2022-10-17: 1000 mg via ORAL

## 2022-10-17 MED ORDER — GABAPENTIN 300 MG PO CAPS
300.0000 mg | ORAL_CAPSULE | ORAL | Status: AC
  Administered 2022-10-17: 300 mg via ORAL

## 2022-10-17 MED ORDER — BUPIVACAINE LIPOSOME 1.3 % IJ SUSP
INTRAMUSCULAR | Status: AC
  Filled 2022-10-17: qty 20

## 2022-10-17 MED ORDER — DEXAMETHASONE SODIUM PHOSPHATE 10 MG/ML IJ SOLN
INTRAMUSCULAR | Status: DC | PRN
  Administered 2022-10-17: 10 mg via INTRAVENOUS

## 2022-10-17 MED ORDER — SODIUM CHLORIDE 0.9 % IV SOLN
2.0000 g | INTRAVENOUS | Status: AC
  Administered 2022-10-17: 2 g via INTRAVENOUS

## 2022-10-17 MED ORDER — SODIUM CHLORIDE 0.9 % IV SOLN
INTRAVENOUS | Status: AC
  Filled 2022-10-17: qty 2

## 2022-10-17 MED ORDER — ROCURONIUM BROMIDE 10 MG/ML (PF) SYRINGE
PREFILLED_SYRINGE | INTRAVENOUS | Status: AC
  Filled 2022-10-17: qty 10

## 2022-10-17 MED ORDER — PROPOFOL 500 MG/50ML IV EMUL
INTRAVENOUS | Status: DC | PRN
  Administered 2022-10-17: 25 ug/kg/min via INTRAVENOUS

## 2022-10-17 MED ORDER — BUPIVACAINE-EPINEPHRINE (PF) 0.25% -1:200000 IJ SOLN
INTRAMUSCULAR | Status: AC
  Filled 2022-10-17: qty 30

## 2022-10-17 MED ORDER — ACETAMINOPHEN 500 MG PO TABS
ORAL_TABLET | ORAL | Status: AC
  Filled 2022-10-17: qty 2

## 2022-10-17 MED ORDER — OXYCODONE HCL 5 MG PO TABS: 5.0000 mg | ORAL_TABLET | Freq: Once | ORAL | Status: DC | PRN

## 2022-10-17 MED ORDER — GABAPENTIN 300 MG PO CAPS
ORAL_CAPSULE | ORAL | Status: AC
  Filled 2022-10-17: qty 1

## 2022-10-17 MED ORDER — FAMOTIDINE 20 MG PO TABS
20.0000 mg | ORAL_TABLET | Freq: Once | ORAL | Status: AC
  Administered 2022-10-17: 20 mg via ORAL

## 2022-10-17 MED ORDER — ROCURONIUM BROMIDE 100 MG/10ML IV SOLN
INTRAVENOUS | Status: DC | PRN
  Administered 2022-10-17: 50 mg via INTRAVENOUS
  Administered 2022-10-17: 10 mg via INTRAVENOUS

## 2022-10-17 MED ORDER — KETOROLAC TROMETHAMINE 30 MG/ML IJ SOLN
INTRAMUSCULAR | Status: AC
  Filled 2022-10-17: qty 1

## 2022-10-17 MED ORDER — MIDAZOLAM HCL 2 MG/2ML IJ SOLN
INTRAMUSCULAR | Status: AC
  Filled 2022-10-17: qty 2

## 2022-10-17 MED ORDER — ONDANSETRON HCL 4 MG/2ML IJ SOLN
INTRAMUSCULAR | Status: DC | PRN
  Administered 2022-10-17: 4 mg via INTRAVENOUS

## 2022-10-17 MED ORDER — SCOPOLAMINE 1 MG/3DAYS TD PT72: 1.0000 | MEDICATED_PATCH | TRANSDERMAL | Status: DC

## 2022-10-17 MED ORDER — ONDANSETRON HCL 4 MG/2ML IJ SOLN
INTRAMUSCULAR | Status: AC
  Filled 2022-10-17: qty 2

## 2022-10-17 MED ORDER — LIDOCAINE HCL (CARDIAC) PF 100 MG/5ML IV SOSY
PREFILLED_SYRINGE | INTRAVENOUS | Status: DC | PRN
  Administered 2022-10-17: 80 mg via INTRAVENOUS

## 2022-10-17 MED ORDER — SUGAMMADEX SODIUM 200 MG/2ML IV SOLN
INTRAVENOUS | Status: DC | PRN
  Administered 2022-10-17: 200 mg via INTRAVENOUS

## 2022-10-17 MED ORDER — HYDROCODONE-ACETAMINOPHEN 5-325 MG PO TABS: 1.0000 | ORAL_TABLET | ORAL | 0 refills | Status: DC | PRN

## 2022-10-17 MED ORDER — BUPIVACAINE-EPINEPHRINE (PF) 0.25% -1:200000 IJ SOLN
INTRAMUSCULAR | Status: DC | PRN
  Administered 2022-10-17: 50 mL

## 2022-10-17 MED ORDER — MIDAZOLAM HCL 2 MG/2ML IJ SOLN
INTRAMUSCULAR | Status: DC | PRN
  Administered 2022-10-17: 2 mg via INTRAVENOUS

## 2022-10-17 MED ORDER — HYDROMORPHONE HCL 1 MG/ML IJ SOLN: 0.2500 mg | INTRAMUSCULAR | Status: DC | PRN

## 2022-10-17 SURGICAL SUPPLY — 35 items
ADH SKN CLS APL DERMABOND .7 (GAUZE/BANDAGES/DRESSINGS) ×1
CUTTER FLEX LINEAR 45M (STAPLE) ×1 IMPLANT
DERMABOND ADVANCED .7 DNX12 (GAUZE/BANDAGES/DRESSINGS) ×1 IMPLANT
ELECT CAUTERY BLADE 6.4 (BLADE) ×1 IMPLANT
ELECT CAUTERY BLADE TIP 2.5 (TIP)
ELECT REM PT RETURN 9FT ADLT (ELECTROSURGICAL) ×1
ELECTRODE CAUTERY BLDE TIP 2.5 (TIP) ×1 IMPLANT
ELECTRODE REM PT RTRN 9FT ADLT (ELECTROSURGICAL) ×1 IMPLANT
GOWN STRL REUS W/ TWL LRG LVL3 (GOWN DISPOSABLE) ×2 IMPLANT
GOWN STRL REUS W/TWL LRG LVL3 (GOWN DISPOSABLE) ×4
IRRIGATION STRYKERFLOW (MISCELLANEOUS) ×1 IMPLANT
IRRIGATOR STRYKERFLOW (MISCELLANEOUS) ×1
IV NS 1000ML (IV SOLUTION) ×1
IV NS 1000ML BAXH (IV SOLUTION) ×1 IMPLANT
MANIFOLD NEPTUNE II (INSTRUMENTS) ×1 IMPLANT
NDL HYPO 22X1.5 SAFETY MO (MISCELLANEOUS) ×1 IMPLANT
NEEDLE HYPO 22X1.5 SAFETY MO (MISCELLANEOUS) ×1 IMPLANT
NS IRRIG 500ML POUR BTL (IV SOLUTION) ×1 IMPLANT
PACK LAP CHOLECYSTECTOMY (MISCELLANEOUS) ×1 IMPLANT
PENCIL SMOKE EVACUATOR (MISCELLANEOUS) ×1 IMPLANT
RELOAD STAPLE 45 3.5 BLU ETS (ENDOMECHANICALS) ×1 IMPLANT
RELOAD STAPLE TA45 3.5 REG BLU (ENDOMECHANICALS) ×2 IMPLANT
SCISSORS METZENBAUM CVD 33 (INSTRUMENTS) IMPLANT
SET TUBE SMOKE EVAC HIGH FLOW (TUBING) ×1 IMPLANT
SHEARS HARMONIC ACE PLUS 36CM (ENDOMECHANICALS) ×1 IMPLANT
SLEEVE Z-THREAD 5X100MM (TROCAR) ×1 IMPLANT
SPONGE T-LAP 18X18 ~~LOC~~+RFID (SPONGE) ×1 IMPLANT
SUT MNCRL AB 4-0 PS2 18 (SUTURE) ×1 IMPLANT
SUT VICRYL 0 UR6 27IN ABS (SUTURE) ×2 IMPLANT
SYR 20ML LL LF (SYRINGE) ×1 IMPLANT
SYS BAG RETRIEVAL 10MM (BASKET) ×1
SYSTEM BAG RETRIEVAL 10MM (BASKET) ×1 IMPLANT
TRAP FLUID SMOKE EVACUATOR (MISCELLANEOUS) ×1 IMPLANT
TROCAR BALLN 12MMX100 BLUNT (TROCAR) ×1 IMPLANT
TROCAR Z-THREAD FIOS 5X100MM (TROCAR) ×1 IMPLANT

## 2022-10-17 NOTE — Discharge Instructions (Addendum)
AMBULATORY SURGERY  DISCHARGE INSTRUCTIONS   The drugs that you were given will stay in your system until tomorrow so for the next 24 hours you should not:  Drive an automobile Make any legal decisions Drink any alcoholic beverage   You may resume regular meals tomorrow.  Today it is better to start with liquids and gradually work up to solid foods.  You may eat anything you prefer, but it is better to start with liquids, then soup and crackers, and gradually work up to solid foods.   Please notify your doctor immediately if you have any unusual bleeding, trouble breathing, redness and pain at the surgery site, drainage, fever, or pain not relieved by medication.    Additional Instructions: PLEASE LEAVE TEAL BRACELET ON FOR 4 DAYS        Please contact your physician with any problems or Same Day Surgery at [phone], Monday through Friday 6 am to 4 pm, or [place] at [facility] number at [phone].  Laparoscopic Appendectomy, Adult  A laparoscopic appendectomy is a surgery to take out the appendix. The appendix is a finger-like structure that is attached to the large intestine. This procedure may be done to prevent an inflamed appendix from bursting (rupturing). It may also be done to treat the infection from an appendix that has ruptured. It is often done right after appendicitis is diagnosed. Appendicitis is inflammation of the appendix. In this surgery, your health care provider uses a thin, lighted tube with a camera (laparoscope) to take out the appendix through three small incisions. This is a minimally invasive surgery. It usually results in less pain, fewer problems, and a quicker recovery than surgery done through a large incision (open appendectomy). Tell a health care provider about: Any allergies you have. All medicines you are taking, including vitamins, herbs, eye drops, creams, and over-the-counter medicines. Any steroid use. This includes creams or  steroids you take by mouth. Any problems you or family members have had with anesthetic medicines. Any bleeding problems you have. Any surgeries you have had. Any medical conditions you have. Whether you are pregnant or may be pregnant. What are the risks? Generally, this is a safe procedure. However, problems may occur, including: Infection. Bleeding. Damage to nearby structures or organs. Allergic reactions to medicines. A collection of pus (abscess). Blood clots in the legs. What happens before the procedure? When to stop eating and drinking Follow instructions from your health care provider about eating and drinking restrictions. You may be asked not to eat or drink as soon as the diagnosis of appendicitis is made. Medicines Ask your health care provider about: Changing or stopping your regular medicines. This is especially important if you are taking diabetes medicines or blood thinners. Taking medicines such as aspirin and ibuprofen. These medicines can thin your blood. Do not take these medicines unless your health care provider tells you to take them. Taking over-the-counter medicines, vitamins, herbs, and supplements. General instructions If you will be going home right after the procedure, plan to have a responsible adult: Take you home from the hospital or clinic. You will not be allowed to drive. Care for you for the time you are told. Ask your health care provider: How your surgery site will be marked. What steps will be taken to help prevent infection. These steps may include: Removing hair at the surgery site. Washing skin with a germ-killing soap. Taking antibiotic medicine. What happens during the procedure?  An IV will be inserted into one  of your veins. You will be given one or more of the following: A medicine to help you relax (sedative). A medicine to numb the area (local anesthetic). A medicine to make you fall asleep (general anesthetic). A thin,  flexible tube (catheter) may be put into your bladder to drain urine. A tube may be passed through your nose or mouth and into your stomach (orogastric or nasogastric tube) to drain any stomach contents. Your surgeon will make three small incisions near your belly button (navel). A gas (carbon dioxide) will be used to fill your abdomen. The gas will make your abdomen expand. This helps the surgeon see clearly and gives him or her more room to work. A laparoscope will be passed through one of the incisions. Other surgical instruments will be passed through the other incisions to assist in surgery. The appendix will be located and removed through one of the incisions. The abdomen may be washed out to remove bacteria. The incisions will be closed with stitches (sutures), staples, or adhesive strips. A bandage (dressing) may be used to cover the incisions. If a tube was inserted into your bladder or stomach, it will be removed. The procedure may vary among health care providers and hospitals. What happens after the procedure? Your blood pressure, heart rate, breathing rate, and blood oxygen level will be monitored until you leave the hospital or clinic. You will be given medicines as needed to control pain and infection. If you were given a sedative during the procedure, it can affect you for several hours. Do not drive or operate machinery until your health care provider says that it is safe. If your appendix did not rupture, you may be able to go home the same day after your surgery. If your appendix ruptured: You will get antibiotic medicine through an IV line. You may be sent home with a temporary drain. Summary A laparoscopic appendectomy is a surgery to take out the appendix. The appendix is removed through three small incisions with the help of a thin, lighted tube that has a camera (laparoscope). This is a safe procedure, but there are some risks. Risks include bleeding, infection, allergic  reaction to medicines, or damage to nearby organs. After the procedure, your blood pressure, heart rate, breathing rate, and blood oxygen level will be monitored until you leave the hospital or clinic. You will be given medicines as needed to control pain and infection. This information is not intended to replace advice given to you by your health care provider. Make sure you discuss any questions you have with your health care provider. Document Revised: 12/28/2020 Document Reviewed: 12/28/2020 Elsevier Patient Education  2024 [employer].

## 2022-10-17 NOTE — Interval H&P Note (Signed)
History and Physical Interval Note:  10/17/2022 1:23 PM  Dana Jarvis  has presented today for surgery, with the diagnosis of appendicitis.  The various methods of treatment have been discussed with the patient and family. After consideration of risks, benefits and other options for treatment, the patient has consented to  Procedure(s): APPENDECTOMY LAPAROSCOPIC (N/A) as a surgical intervention.  The patient's history has been reviewed, patient examined, no change in status, stable for surgery.  I have reviewed the patient's chart and labs.  Questions were answered to the patient's satisfaction.     [name]

## 2022-10-17 NOTE — Op Note (Signed)
laparascopic cecetomy  [name] Date of operation:  10/17/2022  Pre-operative Diagnosis: Hx perforated appendicitis with abscess  Post-operative Diagnosis: Same  Surgeon: Sterling Big, MD, FACS  Anesthesia: General with endotracheal tube  Findings:  Periappendiceal and cecal inflammation with contained abscess Due to chronic inflammation I needed to do a partial cecectomy  Estimated Blood Loss: 5         Specimens: appendix and cecum         Complications:  none  Procedure Details  The patient was seen again in the preop area. The options of surgery versus observation were reviewed with the patient and/or family. The risks of bleeding, infection, recurrence of symptoms, negative laparoscopy, potential for an open procedure, bowel injury, abscess or infection, were all reviewed as well. The patient was taken to Operating Room, identified as GEETA DWORKIN and the procedure verified . A Time Out was held and the above information confirmed.  The patient was placed in the supine position and general anesthesia was induced.  Antibiotic prophylaxis was administered and VT E prophylaxis was in place.    The abdomen was prepped and draped in a sterile fashion. An infraumbilical incision was made. A cutdown technique was used to enter the abdominal cavity. Two vicryl stitches were placed on the fascia and a Hasson trocar inserted. Pneumoperitoneum obtained. Two 5 mm ports were placed under direct visualization.   The appendix was identified and found to be chronically inflamed with adhesions from the appendix to the cecum The appendix was carefully dissected. The mesoappendix was divided with Harmonic scalpel. At the base of the appendix there was an abscess that we drained when mobilizing the cecum. Pus was encountered about 5cc and aspirated. We irrigated the RLQ w saline.  The base of the appendix was dissected out given significant inflammation I decided to mobilize the cecum and  ascending colon from lateral to medial with harmonic to fully mobilize the cecum and perform a cecectomy. THe cecum was divided with a two blue standard load laparoscopic staplers.The  specimen was placed in a Endo Catch bag and removed via the Hasson port. The right lower quadrant and pelvis was then irrigated with  normal saline which was aspirated. Inspection  failed to identify any additional bleeding and there were no signs of bowel injury. Again the right lower quadrant was inspected there was no sign of bleeding or bowel injury therefore pneumoperitoneum was released, all ports were removed.  The umbilical fascia was closed with 0 Vicryl interrupted sutures and the skin incisions were approximated with subcuticular 4-0 Monocryl. Dermabond was placed The patient tolerated the procedure well, there were no complications. The sponge lap and needle count were correct at the end of the procedure.  The patient was taken to the recovery room in stable condition to be admitted for continued care.    Sterling Big, MD FACS

## 2022-10-17 NOTE — Transfer of Care (Signed)
Immediate Anesthesia Transfer of Care Note  Patient: Dana Jarvis  Procedure(s) Performed: APPENDECTOMY LAPAROSCOPIC (Abdomen)  Patient Location: PACU  Anesthesia Type:General  Level of Consciousness: drowsy  Airway & Oxygen Therapy: Patient Spontanous Breathing and Patient connected to face mask oxygen  Post-op Assessment: Report given to RN  Post vital signs: stable  Last Vitals:  Vitals Value Taken Time  BP 133/72 10/17/22 1507  Temp    Pulse 75 10/17/22 1509  Resp 22 10/17/22 1509  SpO2 100 % 10/17/22 1509  Vitals shown include unfiled device data.  Last Pain:  Vitals:   10/17/22 1308  TempSrc: Temporal  PainSc: 0-No pain         Complications: No notable events documented.

## 2022-10-17 NOTE — Anesthesia Procedure Notes (Signed)
Procedure Name: Intubation Date/Time: 10/17/2022 1:55 PM  Performed by: Elisabeth Pigeon, CRNAPre-anesthesia Checklist: Patient identified, Emergency Drugs available, Suction available and Patient being monitored Patient Re-evaluated:Patient Re-evaluated prior to induction Oxygen Delivery Method: Circle system utilized Preoxygenation: Pre-oxygenation with 100% oxygen Induction Type: IV induction Ventilation: Mask ventilation without difficulty Laryngoscope Size: McGraph and 3 Grade View: Grade I Tube type: Oral Tube size: 6.5 mm Number of attempts: 1 Airway Equipment and Method: Stylet and Oral airway Placement Confirmation: ETT inserted through vocal cords under direct vision, positive ETCO2 and breath sounds checked- equal and bilateral Secured at: 20 cm Tube secured with: Tape Dental Injury: Teeth and Oropharynx as per pre-operative assessment

## 2022-10-17 NOTE — Anesthesia Preprocedure Evaluation (Signed)
Anesthesia Evaluation  Patient identified by MRN, date of birth, ID band Patient awake    Reviewed: Allergy & Precautions, NPO status , Patient's Chart, lab work & pertinent test results  History of Anesthesia Complications (+) PONV, AWARENESS UNDER ANESTHESIA and history of anesthetic complications  Airway Mallampati: II  TM Distance: >3 FB Neck ROM: full    Dental no notable dental hx.    Pulmonary neg pulmonary ROS   Pulmonary exam normal        Cardiovascular negative cardio ROS Normal cardiovascular exam     Neuro/Psych negative neurological ROS  negative psych ROS   GI/Hepatic negative GI ROS, Neg liver ROS,,,  Endo/Other  negative endocrine ROS    Renal/GU      Musculoskeletal   Abdominal   Peds  Hematology negative hematology ROS (+)   Anesthesia Other Findings Past Medical History: 08/20/2022: Acute perforated appendicitis No date: Arthritis     Comment:  hands, feet No date: Complication of anesthesia     Comment:  has awakened during procedures No date: Headache     Comment:  sinus No date: History of hiatal hernia     Comment:  small No date: Motion sickness     Comment:  ocean ships No date: Seasonal allergies No date: Wears contact lenses  Past Surgical History: 05/19/2020: BREAST BIOPSY; Right     Comment:  Stereo Bx, X-clip, neg 06/21/2020: BREAST BIOPSY WITH RADIO FREQUENCY LOCALIZER; Right     Comment:  Procedure: EXCISION OF BREAST BIOPSY with Radiofrequency              Tag;  Surgeon: Carolan Shiver, MD;  Location: [facility]              ORS;  Service: General;  Laterality: Right; 06/21/2020: BREAST EXCISIONAL BIOPSY; Right     Comment:  neg 1988: CESAREAN SECTION 12/31/2016: COLONOSCOPY WITH PROPOFOL; N/A     Comment:  Procedure: COLONOSCOPY WITH PROPOFOL;  Surgeon: Midge Minium, MD;  Location: [facility] ENDOSCOPY;  Service:               Endoscopy;  Laterality:  N/A; 2007: FRACTURE SURGERY; Right     Comment:  ankle 2018: HARDWARE REMOVAL; Right     Comment:  Ankle 1972: TONSILLECTOMY 2005: TOTAL ABDOMINAL HYSTERECTOMY W/ BILATERAL SALPINGOOPHORECTOMY 2002: WRIST GANGLION EXCISION; Left  BMI 31.45   Reproductive/Obstetrics negative OB ROS                             Anesthesia Physical Anesthesia Plan  ASA: 2  Anesthesia Plan: General ETT   Post-op Pain Management: Tylenol PO (pre-op)*, Gabapentin PO (pre-op)* and Celebrex PO (pre-op)*   Induction: Intravenous  PONV Risk Score and Plan: 4 or greater and Ondansetron, Dexamethasone, Midazolam, Treatment may vary due to age or medical condition and Scopolamine patch - Pre-op  Airway Management Planned: Oral ETT  Additional Equipment:   Intra-op Plan:   Post-operative Plan: Extubation in OR  Informed Consent: I have reviewed the patients History and Physical, chart, labs and discussed the procedure including the risks, benefits and alternatives for the proposed anesthesia with the patient or authorized representative who has indicated his/her understanding and acceptance.     Dental Advisory Given  Plan Discussed with: Anesthesiologist, CRNA and Surgeon  Anesthesia Plan Comments: (Patient consented for risks of anesthesia including but not limited to:  -  adverse reactions to medications - risk of airway placement if required - damage to eyes, teeth, lips or other oral mucosa - nerve damage due to positioning  - sore throat or hoarseness - Damage to heart, brain, nerves, lungs, other parts of body or loss of life  Patient voiced understanding.)        Anesthesia Quick Evaluation

## 2022-10-18 ENCOUNTER — Encounter: Payer: Self-pay | Admitting: Surgery

## 2022-10-21 ENCOUNTER — Encounter: Payer: BC Managed Care – PPO | Admitting: Nurse Practitioner

## 2022-10-21 NOTE — Anesthesia Postprocedure Evaluation (Signed)
Anesthesia Post Note  Patient: Dana Jarvis  Procedure(s) Performed: APPENDECTOMY LAPAROSCOPIC (Abdomen)  Patient location during evaluation: PACU Anesthesia Type: General Level of consciousness: awake and alert Pain management: pain level controlled Vital Signs Assessment: post-procedure vital signs reviewed and stable Respiratory status: spontaneous breathing, nonlabored ventilation and respiratory function stable Cardiovascular status: blood pressure returned to baseline and stable Postop Assessment: no apparent nausea or vomiting Anesthetic complications: no   No notable events documented.   Last Vitals:  Vitals:   10/17/22 1540 10/17/22 1558  BP: (!) 146/72 (!) 158/83  Pulse: (!) 52 (!) 56  Resp: (!) 25 15  Temp: (!) 36.4 C 36.9 C  SpO2: 97% 100%    Last Pain:  Vitals:   10/18/22 1057  TempSrc:   PainSc: 2                  Foye Deer

## 2022-10-31 ENCOUNTER — Encounter: Payer: Self-pay | Admitting: Physician Assistant

## 2022-10-31 ENCOUNTER — Ambulatory Visit (INDEPENDENT_AMBULATORY_CARE_PROVIDER_SITE_OTHER): Payer: BC Managed Care – PPO | Admitting: Physician Assistant

## 2022-10-31 VITALS — BP 173/98 | HR 75 | Temp 98.2°F | Ht 62.0 in | Wt 173.8 lb

## 2022-10-31 DIAGNOSIS — Z9049 Acquired absence of other specified parts of digestive tract: Secondary | ICD-10-CM | POA: Insufficient documentation

## 2022-10-31 DIAGNOSIS — K3533 Acute appendicitis with perforation and localized peritonitis, with abscess: Secondary | ICD-10-CM

## 2022-10-31 DIAGNOSIS — K353 Acute appendicitis with localized peritonitis, without perforation or gangrene: Secondary | ICD-10-CM

## 2022-10-31 DIAGNOSIS — Z09 Encounter for follow-up examination after completed treatment for conditions other than malignant neoplasm: Secondary | ICD-10-CM

## 2022-10-31 NOTE — Progress Notes (Signed)
[facility] POST-OP OFFICE VISIT  10/31/2022  HPI: GERTRUDE CLAYWELL is a 57 y.o. female 14 days s/p robotic assisted laparoscopic partial cecectomy for history of perforated appendicitis with abscess   She is doing well Minimal umbilical soreness; never needed narcotics No fever, chills, nausea, emesis Incisions are well healed No other complaints   Vital signs: BP (!) 173/98   Pulse 75   Temp 98.2 F (36.8 C)   Ht 5\' 2"  (1.575 m)   Wt 173 lb 12.8 oz (78.8 kg)   LMP  (LMP Unknown)   SpO2 97%   BMI 31.79 kg/m    Physical Exam: Constitutional: Well appearing female, NAD Abdomen: Soft, non-tender, non-distended, no rebound/guarding Skin: Laparoscopic incisions are healing well, no erythema or drainage   Assessment/Plan: This is a 57 y.o. female 14 days s/p robotic assisted laparoscopic partial cecectomy for history of perforated appendicitis with abscess    - Pain control prn  - Reviewed wound care recommendation  - Reviewed lifting restrictions; 4 weeks total  - Reviewed surgical pathology; Appendicitis   - She can follow up on as needed basis; She understands to call with questions/concerns  -- Lynden Oxford, PA-C [facility] 10/31/2022, 3:31 PM M-F: 7am - 4pm

## 2022-11-06 ENCOUNTER — Encounter: Payer: BC Managed Care – PPO | Admitting: Nurse Practitioner

## 2022-11-06 DIAGNOSIS — E7800 Pure hypercholesterolemia, unspecified: Secondary | ICD-10-CM

## 2022-11-06 DIAGNOSIS — Z803 Family history of malignant neoplasm of breast: Secondary | ICD-10-CM

## 2022-11-06 DIAGNOSIS — Z0000 Encounter for general adult medical examination without abnormal findings: Secondary | ICD-10-CM

## 2022-11-06 DIAGNOSIS — J301 Allergic rhinitis due to pollen: Secondary | ICD-10-CM

## 2022-11-12 ENCOUNTER — Ambulatory Visit
Admission: RE | Admit: 2022-11-12 | Discharge: 2022-11-12 | Disposition: A | Discharge status: Home / Self Care | Payer: BC Managed Care – PPO | Source: Ambulatory Visit | Attending: Nurse Practitioner | Admitting: Nurse Practitioner

## 2022-11-12 DIAGNOSIS — Z1231 Encounter for screening mammogram for malignant neoplasm of breast: Secondary | ICD-10-CM | POA: Insufficient documentation

## 2022-11-15 NOTE — Progress Notes (Signed)
Contacted via MyChart   Normal mammogram, may repeat in one year:)

## 2022-12-15 NOTE — Patient Instructions (Signed)
Be Involved in Caring For Your Health:  Taking Medications When medications are taken as directed, they can greatly improve your health. But if they are not taken as prescribed, they may not work. In some cases, not taking them correctly can be harmful. To help ensure your treatment remains effective and safe, understand your medications and how to take them. Bring your medications to each visit for review by your provider.  Your lab results, notes, and after visit summary will be available on My Chart. We strongly encourage you to use this feature. If lab results are abnormal the clinic will contact you with the appropriate steps. If the clinic does not contact you assume the results are satisfactory. You can always view your results on My Chart. If you have questions regarding your health or results, please contact the clinic during office hours. You can also ask questions on My Chart.  We at [facility] are grateful that you chose [place] to provide your care. We strive to provide evidence-based and compassionate care and are always looking for feedback. If you get a survey from the clinic please complete this so we can hear your opinions.  Preventing High Cholesterol Cholesterol is a white, waxy substance similar to fat that the human body needs to help build cells. The liver makes all the cholesterol that a person's body needs. Having high cholesterol (hypercholesterolemia) increases your risk for heart disease and stroke. Extra or excess cholesterol comes from the food that you eat. High cholesterol can often be prevented with diet and lifestyle changes. If you already have high cholesterol, you can control it with diet, lifestyle changes, and medicines. How can high cholesterol affect me? If you have high cholesterol, fatty deposits (plaques) may build up on the walls of your blood vessels. The blood vessels that carry blood away from your heart are called arteries. Plaques make the  arteries narrower and stiffer. This in turn can: Restrict or block blood flow and cause blood clots to form. Increase your risk for heart attack and stroke. What can increase my risk for high cholesterol? This condition is more likely to develop in people who: Eat foods that are high in saturated fat or cholesterol. Saturated fat is mostly found in foods that come from animal sources. Are overweight. Are not getting enough exercise. Use products that contain nicotine or tobacco, such as cigarettes, e-cigarettes, and chewing tobacco. Have a family history of high cholesterol (familial hypercholesterolemia). What actions can I take to prevent this? Nutrition  Eat less saturated fat. Avoid trans fats (partially hydrogenated oils). These are often found in margarine and in some baked goods, fried foods, and snacks bought in packages. Avoid precooked or cured meat, such as bacon, sausages, or meat loaves. Avoid foods and drinks that have added sugars. Eat more fruits, vegetables, and whole grains. Choose healthy sources of protein, such as fish, poultry, lean cuts of red meat, beans, peas, lentils, and nuts. Choose healthy sources of fat, such as: Nuts. Vegetable oils, especially olive oil. Fish that have healthy fats, such as omega-3 fatty acids. These fish include mackerel or salmon. Lifestyle Lose weight if you are overweight. Maintaining a healthy body mass index (BMI) can help prevent or control high cholesterol. It can also lower your risk for diabetes and high blood pressure. Ask your health care provider to help you with a diet and exercise plan to lose weight safely. Do not use any products that contain nicotine or tobacco. These products include cigarettes,  chewing tobacco, and vaping devices, such as e-cigarettes. If you need help quitting, ask your health care provider. Alcohol use Do not drink alcohol if: Your health care provider tells you not to drink. You are pregnant, may be  pregnant, or are planning to become pregnant. If you drink alcohol: Limit how much you have to: 0-1 drink a day for women. 0-2 drinks a day for men. Know how much alcohol is in your drink. In the [place] one drink equals one 12 oz bottle of beer (355 mL), one 5 oz glass of wine (148 mL), or one 1 oz glass of hard liquor (44 mL). Activity  Get enough exercise. Do exercises as told by your health care provider. Each week, do at least 150 minutes of exercise that takes a medium level of effort (moderate-intensity exercise). This kind of exercise: Makes your heart beat faster while allowing you to still be able to talk. Can be done in short sessions several times a day or longer sessions a few times a week. For example, on 5 days each week, you could walk fast or ride your bike 3 times a day for 10 minutes each time. Medicines Your health care provider may recommend medicines to help lower cholesterol. This may be a medicine to lower the amount of cholesterol that your liver makes. You may need medicine if: Diet and lifestyle changes have not lowered your cholesterol enough. You have high cholesterol and other risk factors for heart disease or stroke. Take over-the-counter and prescription medicines only as told by your health care provider. General information Manage your risk factors for high cholesterol. Talk with your health care provider about all your risk factors and how to lower your risk. Manage other conditions that you have, such as diabetes or high blood pressure (hypertension). Have blood tests to check your cholesterol levels at regular points in time as told by your health care provider. Keep all follow-up visits. This is important. Where to find more information American Heart Association: www.heart.org National Heart, Lung, and Blood Institute: [employer] Summary High cholesterol increases your risk for heart disease and stroke. By keeping your cholesterol level low, you  can reduce your risk for these conditions. High cholesterol can often be prevented with diet and lifestyle changes. Work with your health care provider to manage your risk factors, and have your blood tested regularly. This information is not intended to replace advice given to you by your health care provider. Make sure you discuss any questions you have with your health care provider. Document Revised: 10/19/2021 Document Reviewed: 05/22/2020 Elsevier Patient Education  2024 [employer].

## 2022-12-18 ENCOUNTER — Ambulatory Visit (INDEPENDENT_AMBULATORY_CARE_PROVIDER_SITE_OTHER): Payer: BC Managed Care – PPO | Admitting: Nurse Practitioner

## 2022-12-18 VITALS — BP 122/76 | HR 75 | Temp 98.1°F | Ht 61.0 in | Wt 172.0 lb

## 2022-12-18 DIAGNOSIS — J301 Allergic rhinitis due to pollen: Secondary | ICD-10-CM

## 2022-12-18 DIAGNOSIS — Z803 Family history of malignant neoplasm of breast: Secondary | ICD-10-CM | POA: Diagnosis not present

## 2022-12-18 DIAGNOSIS — Z23 Encounter for immunization: Secondary | ICD-10-CM

## 2022-12-18 DIAGNOSIS — E7800 Pure hypercholesterolemia, unspecified: Secondary | ICD-10-CM | POA: Diagnosis not present

## 2022-12-18 DIAGNOSIS — Z0000 Encounter for general adult medical examination without abnormal findings: Secondary | ICD-10-CM | POA: Diagnosis not present

## 2022-12-18 NOTE — Progress Notes (Signed)
BP 122/76 (BP Location: Left Arm, Cuff Size: Normal)   Pulse 75   Temp 98.1 F (36.7 C) (Oral)   Ht 5\' 1"  (1.549 m)   Wt 172 lb (78 kg)   LMP  (LMP Unknown)   SpO2 95%   BMI 32.50 kg/m    Subjective:    Patient ID: Dana Jarvis, female    DOB: Aug 25, 1965, 57 y.o.   MRN: [id number]  HPI: Dana Jarvis is a 57 y.o. female presenting on 12/18/2022 for comprehensive medical examination. Current medical complaints include:none  She currently lives with: self Menopausal Symptoms: no  The ASCVD Risk score (Arnett DK, et al., 2019) failed to calculate for the following reasons:   The valid HDL cholesterol range is 20 to 100 mg/dL  Depression Screen done today and results listed below:     12/18/2022    1:49 PM 09/20/2021    8:42 AM 10/11/2020    8:17 AM 10/04/2020   10:03 AM 01/31/2020    1:23 PM  Depression screen PHQ 2/9  Decreased Interest 0 0 0 0 0  Down, Depressed, Hopeless 0 0 0 0 0  PHQ - 2 Score 0 0 0 0 0  Altered sleeping 0 0   0  Tired, decreased energy 0 0   0  Change in appetite 0 0   0  Feeling bad or failure about yourself  0 0   0  Trouble concentrating 0 0   0  Moving slowly or fidgety/restless 0 0   0  Suicidal thoughts 0 0   0  PHQ-9 Score 0 0   0  Difficult doing work/chores Not difficult at all Not difficult at all   Not difficult at all      12/18/2022    1:50 PM 09/20/2021    8:43 AM  GAD 7 : Generalized Anxiety Score  Nervous, Anxious, on Edge 0 0  Control/stop worrying 0 0  Worry too much - different things 0 0  Trouble relaxing 0 0  Restless 0 0  Easily annoyed or irritable 0 0  Afraid - awful might happen 0 0  Total GAD 7 Score 0 0  Anxiety Difficulty Not difficult at all Not difficult at all      01/31/2020    1:22 PM 10/04/2020   10:03 AM 10/11/2020    8:17 AM 09/20/2021    8:41 AM 12/18/2022    1:49 PM  Fall Risk  Falls in the past year? 0 0 0 0 0  Was there an injury with Fall? 0 0 0 0 0  Fall Risk Category Calculator 0 0 0 0 0  Fall  Risk Category (Retired) Low Low Low Low   (RETIRED) Patient Fall Risk Level Low fall risk   Low fall risk   Patient at Risk for Falls Due to No Fall Risks No Fall Risks No Fall Risks No Fall Risks No Fall Risks  Fall risk Follow up Falls evaluation completed Falls evaluation completed Falls evaluation completed Falls evaluation completed Falls evaluation completed    Functional Status Survey: Is the patient deaf or have difficulty hearing?: No Does the patient have difficulty seeing, even when wearing glasses/contacts?: No Does the patient have difficulty concentrating, remembering, or making decisions?: No Does the patient have difficulty walking or climbing stairs?: No Does the patient have difficulty dressing or bathing?: No Does the patient have difficulty doing errands alone such as visiting a doctor's office or shopping?: No  Past Medical History:  Past Medical History:  Diagnosis Date   Acute perforated appendicitis 08/20/2022   Allergy    Arthritis    hands, feet   Complication of anesthesia    has awakened during procedures   Headache    sinus   History of hiatal hernia    small   Motion sickness    ocean ships   Seasonal allergies    Wears contact lenses     Surgical History:  Past Surgical History:  Procedure Laterality Date   APPENDECTOMY  July 2024   BREAST BIOPSY Right 05/19/2020   Stereo Bx, X-clip, neg   BREAST BIOPSY WITH RADIO FREQUENCY LOCALIZER Right 06/21/2020   Procedure: EXCISION OF BREAST BIOPSY with Radiofrequency Tag;  Surgeon: Carolan Shiver, MD;  Location: [facility];  Service: General;  Laterality: Right;   BREAST EXCISIONAL BIOPSY Right 06/21/2020   neg   CESAREAN SECTION  1988   COLONOSCOPY WITH PROPOFOL N/A 12/31/2016   Procedure: COLONOSCOPY WITH PROPOFOL;  Surgeon: Midge Minium, MD;  Location: [facility];  Service: Endoscopy;  Laterality: N/A;   FRACTURE SURGERY Right 2007   ankle   HARDWARE REMOVAL Right 2018   Ankle    LAPAROSCOPIC APPENDECTOMY N/A 10/17/2022   Procedure: APPENDECTOMY LAPAROSCOPIC;  Surgeon: Leafy Ro, MD;  Location: [facility];  Service: General;  Laterality: N/A;   TONSILLECTOMY  1972   TOTAL ABDOMINAL HYSTERECTOMY W/ BILATERAL SALPINGOOPHORECTOMY  2005   TOTAL ABDOMINAL HYSTERECTOMY W/ BILATERAL SALPINGOOPHORECTOMY  2005   WRIST GANGLION EXCISION Left 2002    Medications:  No current outpatient medications on file prior to visit.   No current facility-administered medications on file prior to visit.    Allergies:  Allergies  Allergen Reactions   Chlorhexidine Gluconate Rash   Latex Rash   Other Rash    Chloraswab   Sulfa Antibiotics Rash    Social History:  Social History   Socioeconomic History   Marital status: Widowed    Spouse name: Not on file   Number of children: 1   Years of education: Not on file   Highest education level: Associate degree: occupational, Scientist, product/process development, or vocational program  Occupational History   Not on file  Tobacco Use   Smoking status: Never   Smokeless tobacco: Never  Vaping Use   Vaping status: Never Used  Substance and Sexual Activity   Alcohol use: Yes    Alcohol/week: 4.0 standard drinks of alcohol    Types: 4 Glasses of wine per week   Drug use: No   Sexual activity: Yes    Birth control/protection: Post-menopausal  Other Topics Concern   Not on file  Social History Narrative   Not on file   Social Determinants of Health   Financial Resource Strain: Low Risk  (12/18/2022)   Overall Financial Resource Strain (CARDIA)    Difficulty of Paying Living Expenses: Not very hard  Food Insecurity: No Food Insecurity (12/18/2022)   Hunger Vital Sign    Worried About Running Out of Food in the Last Year: Never true    Ran Out of Food in the Last Year: Never true  Transportation Needs: No Transportation Needs (12/18/2022)   PRAPARE - Administrator, Civil Service (Medical): No    Lack of Transportation (Non-Medical):  No  Physical Activity: Insufficiently Active (12/18/2022)   Exercise Vital Sign    Days of Exercise per Week: 2 days    Minutes of Exercise per Session: 50 min  Stress:  No Stress Concern Present (12/18/2022)   [employer] of Occupational Health - Occupational Stress Questionnaire    Feeling of Stress : Only a little  Social Connections: Socially Isolated (12/18/2022)   Social Connection and Isolation Panel [NHANES]    Frequency of Communication with Friends and Family: More than three times a week    Frequency of Social Gatherings with Friends and Family: More than three times a week    Attends Religious Services: Never    Database administrator or Organizations: No    Attends Banker Meetings: Not on file    Marital Status: Widowed  Intimate Partner Violence: Not At Risk (08/21/2022)   Humiliation, Afraid, Rape, and Kick questionnaire    Fear of Current or Ex-Partner: No    Emotionally Abused: No    Physically Abused: No    Sexually Abused: No   Social History   Tobacco Use  Smoking Status Never  Smokeless Tobacco Never   Social History   Substance and Sexual Activity  Alcohol Use Yes   Alcohol/week: 4.0 standard drinks of alcohol   Types: 4 Glasses of wine per week    Family History:  Family History  Problem Relation Age of Onset   Arthritis Mother    Cancer Mother        breast   Glaucoma Mother    Breast cancer Mother 49   Cancer Father        lung   Hypertension Sister    Hypertension Brother    Breast cancer Maternal Aunt    Breast cancer Cousin    Breast cancer Cousin    Colon cancer Neg Hx    Ovarian cancer Neg Hx     Past medical history, surgical history, medications, allergies, family history and social history reviewed with patient today and changes made to appropriate areas of the chart.   ROS All other ROS negative except what is listed above and in the HPI.      Objective:    BP 122/76 (BP Location: Left Arm, Cuff Size:  Normal)   Pulse 75   Temp 98.1 F (36.7 C) (Oral)   Ht 5\' 1"  (1.549 m)   Wt 172 lb (78 kg)   LMP  (LMP Unknown)   SpO2 95%   BMI 32.50 kg/m   Wt Readings from Last 3 Encounters:  12/18/22 172 lb (78 kg)  10/31/22 173 lb 12.8 oz (78.8 kg)  10/17/22 172 lb (78 kg)    Physical Exam Vitals and nursing note reviewed. Exam conducted with a chaperone present.  Constitutional:      General: She is awake. She is not in acute distress.    Appearance: She is well-developed and well-groomed. She is not ill-appearing or toxic-appearing.  HENT:     Head: Normocephalic and atraumatic.     Right Ear: Hearing, tympanic membrane, ear canal and external ear normal. No drainage.     Left Ear: Hearing, tympanic membrane, ear canal and external ear normal. No drainage.     Nose: Nose normal.     Right Sinus: No maxillary sinus tenderness or frontal sinus tenderness.     Left Sinus: No maxillary sinus tenderness or frontal sinus tenderness.     Mouth/Throat:     Mouth: Mucous membranes are moist.     Pharynx: Oropharynx is clear. Uvula midline. No pharyngeal swelling, oropharyngeal exudate or posterior oropharyngeal erythema.  Eyes:     General: Lids are normal.  Right eye: No discharge.        Left eye: No discharge.     Extraocular Movements: Extraocular movements intact.     Conjunctiva/sclera: Conjunctivae normal.     Pupils: Pupils are equal, round, and reactive to light.     Visual Fields: Right eye visual fields normal and left eye visual fields normal.  Neck:     Thyroid: No thyromegaly.     Vascular: No carotid bruit.     Trachea: Trachea normal.  Cardiovascular:     Rate and Rhythm: Normal rate and regular rhythm.     Heart sounds: Normal heart sounds. No murmur heard.    No gallop.  Pulmonary:     Effort: Pulmonary effort is normal. No accessory muscle usage or respiratory distress.     Breath sounds: Normal breath sounds.  Chest:  Breasts:    Right: Normal.     Left:  Normal.  Abdominal:     General: Bowel sounds are normal.     Palpations: Abdomen is soft. There is no hepatomegaly or splenomegaly.     Tenderness: There is no abdominal tenderness.  Musculoskeletal:        General: Normal range of motion.     Cervical back: Normal range of motion and neck supple.     Right lower leg: No edema.     Left lower leg: No edema.  Lymphadenopathy:     Head:     Right side of head: No submental, submandibular, tonsillar, preauricular or posterior auricular adenopathy.     Left side of head: No submental, submandibular, tonsillar, preauricular or posterior auricular adenopathy.     Cervical: No cervical adenopathy.     Upper Body:     Right upper body: No supraclavicular, axillary or pectoral adenopathy.     Left upper body: No supraclavicular, axillary or pectoral adenopathy.  Skin:    General: Skin is warm and dry.     Capillary Refill: Capillary refill takes less than 2 seconds.     Findings: No rash.  Neurological:     Mental Status: She is alert and oriented to person, place, and time.     Gait: Gait is intact.     Deep Tendon Reflexes: Reflexes are normal and symmetric.     Reflex Scores:      Brachioradialis reflexes are 2+ on the right side and 2+ on the left side.      Patellar reflexes are 2+ on the right side and 2+ on the left side. Psychiatric:        Attention and Perception: Attention normal.        Mood and Affect: Mood normal.        Speech: Speech normal.        Behavior: Behavior normal. Behavior is cooperative.        Thought Content: Thought content normal.        Judgment: Judgment normal.     Results for orders placed or performed during the hospital encounter of 08/20/22  Blood culture (routine x 2)   Specimen: BLOOD  Result Value Ref Range   Specimen Description BLOOD BLOOD RIGHT ARM    Special Requests      BOTTLES DRAWN AEROBIC AND ANAEROBIC Blood Culture adequate volume   Culture      NO GROWTH 5 DAYS Performed at  [facility], [address], [place], [place] [address]    Report Status 08/25/2022 FINAL   Blood culture (routine x 2)  Specimen: BLOOD  Result Value Ref Range   Specimen Description BLOOD BLOOD RIGHT ARM    Special Requests      BOTTLES DRAWN AEROBIC AND ANAEROBIC Blood Culture adequate volume   Culture      NO GROWTH 5 DAYS Performed at [facility], [address] [place], [place] [address]    Report Status 08/25/2022 FINAL   Surgical PCR screen   Specimen: Nasal Mucosa; Nasal Swab  Result Value Ref Range   MRSA, PCR NEGATIVE NEGATIVE   Staphylococcus aureus NEGATIVE NEGATIVE  Lipase, blood  Result Value Ref Range   Lipase 26 11 - 51 U/L  Comprehensive metabolic panel  Result Value Ref Range   Sodium 135 135 - 145 mmol/L   Potassium 3.5 3.5 - 5.1 mmol/L   Chloride 99 98 - 111 mmol/L   CO2 23 22 - 32 mmol/L   Glucose, Bld 118 (H) 70 - 99 mg/dL   BUN 13 6 - 20 mg/dL   Creatinine, Ser 6.96 0.44 - 1.00 mg/dL   Calcium 9.1 8.9 - 29.5 mg/dL   Total Protein 7.6 6.5 - 8.1 g/dL   Albumin 3.9 3.5 - 5.0 g/dL   AST 18 15 - 41 U/L   ALT 13 0 - 44 U/L   Alkaline Phosphatase 83 38 - 126 U/L   Total Bilirubin 1.2 0.3 - 1.2 mg/dL   GFR, Estimated >28 >41 mL/min   Anion gap 13 5 - 15  CBC  Result Value Ref Range   WBC 15.6 (H) 4.0 - 10.5 K/uL   RBC 4.24 3.87 - 5.11 MIL/uL   Hemoglobin 14.1 12.0 - 15.0 g/dL   HCT 32.4 40.1 - 02.7 %   MCV 96.2 80.0 - 100.0 fL   MCH 33.3 26.0 - 34.0 pg   MCHC 34.6 30.0 - 36.0 g/dL   RDW 25.3 (L) 66.4 - 40.3 %   Platelets 262 150 - 400 K/uL   nRBC 0.0 0.0 - 0.2 %  Urinalysis, Routine w reflex microscopic -Urine, Clean Catch  Result Value Ref Range   Color, Urine AMBER (A) YELLOW   APPearance HAZY (A) CLEAR   Specific Gravity, Urine 1.027 1.005 - 1.030   pH 5.0 5.0 - 8.0   Glucose, UA NEGATIVE NEGATIVE mg/dL   Hgb urine dipstick NEGATIVE NEGATIVE   Bilirubin Urine NEGATIVE NEGATIVE   Ketones, ur 80 (A) NEGATIVE mg/dL    Protein, ur 474 (A) NEGATIVE mg/dL   Nitrite NEGATIVE NEGATIVE   Leukocytes,Ua SMALL (A) NEGATIVE   RBC / HPF 6-10 0 - 5 RBC/hpf   WBC, UA 11-20 0 - 5 WBC/hpf   Bacteria, UA RARE (A) NONE SEEN   Squamous Epithelial / HPF 0-5 0 - 5 /HPF   Mucus PRESENT   Lactic acid, plasma  Result Value Ref Range   Lactic Acid, Venous 1.1 0.5 - 1.9 mmol/L  Lactic acid, plasma  Result Value Ref Range   Lactic Acid, Venous 0.8 0.5 - 1.9 mmol/L  HIV Antibody (routine testing w rflx)  Result Value Ref Range   HIV Screen 4th Generation wRfx Non Reactive Non Reactive  Basic metabolic panel  Result Value Ref Range   Sodium 137 135 - 145 mmol/L   Potassium 3.5 3.5 - 5.1 mmol/L   Chloride 107 98 - 111 mmol/L   CO2 20 (L) 22 - 32 mmol/L   Glucose, Bld 74 70 - 99 mg/dL   BUN 10 6 - 20 mg/dL   Creatinine, Ser 2.59  0.44 - 1.00 mg/dL   Calcium 7.9 (L) 8.9 - 10.3 mg/dL   GFR, Estimated >16 >10 mL/min   Anion gap 10 5 - 15  CBC  Result Value Ref Range   WBC 10.1 4.0 - 10.5 K/uL   RBC 3.54 (L) 3.87 - 5.11 MIL/uL   Hemoglobin 11.9 (L) 12.0 - 15.0 g/dL   HCT 96.0 (L) 45.4 - 09.8 %   MCV 99.2 80.0 - 100.0 fL   MCH 33.6 26.0 - 34.0 pg   MCHC 33.9 30.0 - 36.0 g/dL   RDW 11.9 (L) 14.7 - 82.9 %   Platelets 199 150 - 400 K/uL   nRBC 0.0 0.0 - 0.2 %  CBC  Result Value Ref Range   WBC 7.2 4.0 - 10.5 K/uL   RBC 3.34 (L) 3.87 - 5.11 MIL/uL   Hemoglobin 11.2 (L) 12.0 - 15.0 g/dL   HCT 56.2 (L) 13.0 - 86.5 %   MCV 96.7 80.0 - 100.0 fL   MCH 33.5 26.0 - 34.0 pg   MCHC 34.7 30.0 - 36.0 g/dL   RDW 78.4 (L) 69.6 - 29.5 %   Platelets 210 150 - 400 K/uL   nRBC 0.0 0.0 - 0.2 %      Assessment & Plan:   Problem List Items Addressed This Visit       Respiratory   Allergic rhinitis    Chronic, ongoing.  Continue Zyrtec and Flonase at this time.  May benefit from allergy testing in future.      Relevant Orders   CBC with Differential/Platelet     Other   Elevated LDL cholesterol level - Primary     Noted on past labs, no current medications.  Plan to recheck lipid panel and start medication as needed.      Relevant Orders   Lipid Panel w/o Chol/HDL Ratio   Comprehensive metabolic panel   Family history of breast cancer    Both on maternal and paternal sides -- cousins, aunt, and mother.  Recommend annual mammogram and discussed referral to oncology for genetic testing, she wishes to think about this.      Other Visit Diagnoses     Encounter for annual physical exam       Annual physical today and health maintenance reviewed.   Relevant Orders   TSH        Follow up plan: Return in about 1 year (around 12/18/2023) for Annual Exam.   LABORATORY TESTING:  - Pap smear: had total hysterectomy  IMMUNIZATIONS:   - Tdap: Tetanus vaccination status reviewed: last tetanus booster within 10 years. - Influenza: obtains at [facility] - Pneumovax: Not applicable - Prevnar: Not applicable - COVID: Up to date x 2 - HPV: Not applicable - Shingrix vaccine: Refused  SCREENING: -Mammogram: Up to date performed 11/12/22 - Colonoscopy: Up to date due next 01/01/2027 - Bone Density: Not applicable  -Hearing Test: Not applicable  -Spirometry: Not applicable   PATIENT COUNSELING:   Advised to take 1 mg of folate supplement per day if capable of pregnancy.   Sexuality: Discussed sexually transmitted diseases, partner selection, use of condoms, avoidance of unintended pregnancy  and contraceptive alternatives.   Advised to avoid cigarette smoking.  I discussed with the patient that most people either abstain from alcohol or drink within safe limits (<=14/week and <=4 drinks/occasion for males, <=7/weeks and <= 3 drinks/occasion for females) and that the risk for alcohol disorders and other health effects rises proportionally with the number of  drinks per week and how often a drinker exceeds daily limits.  Discussed cessation/primary prevention of drug use and availability of treatment for  abuse.   Diet: Encouraged to adjust caloric intake to maintain  or achieve ideal body weight, to reduce intake of dietary saturated fat and total fat, to limit sodium intake by avoiding high sodium foods and not adding table salt, and to maintain adequate dietary potassium and calcium preferably from fresh fruits, vegetables, and low-fat dairy products.    Stressed the importance of regular exercise  Injury prevention: Discussed safety belts, safety helmets, smoke detector, smoking near bedding or upholstery.   Dental health: Discussed importance of regular tooth brushing, flossing, and dental visits.    NEXT PREVENTATIVE PHYSICAL DUE IN 1 YEAR. Return in about 1 year (around 12/18/2023) for Annual Exam.

## 2022-12-18 NOTE — Assessment & Plan Note (Signed)
Chronic, ongoing.  Continue Zyrtec and Flonase at this time.  May benefit from allergy testing in future.

## 2022-12-18 NOTE — Assessment & Plan Note (Signed)
Noted on past labs, no current medications.  Plan to recheck lipid panel and start medication as needed.

## 2022-12-18 NOTE — Assessment & Plan Note (Signed)
Both on maternal and paternal sides -- cousins, aunt, and mother.  Recommend annual mammogram and discussed referral to oncology for genetic testing, she wishes to think about this.

## 2023-04-28 DIAGNOSIS — D2262 Melanocytic nevi of left upper limb, including shoulder: Secondary | ICD-10-CM | POA: Diagnosis not present

## 2023-04-28 DIAGNOSIS — L570 Actinic keratosis: Secondary | ICD-10-CM | POA: Diagnosis not present

## 2023-04-28 DIAGNOSIS — L820 Inflamed seborrheic keratosis: Secondary | ICD-10-CM | POA: Diagnosis not present

## 2023-04-28 DIAGNOSIS — D2261 Melanocytic nevi of right upper limb, including shoulder: Secondary | ICD-10-CM | POA: Diagnosis not present

## 2023-04-28 DIAGNOSIS — L304 Erythema intertrigo: Secondary | ICD-10-CM | POA: Diagnosis not present

## 2023-04-28 DIAGNOSIS — D225 Melanocytic nevi of trunk: Secondary | ICD-10-CM | POA: Diagnosis not present

## 2023-04-28 DIAGNOSIS — D485 Neoplasm of uncertain behavior of skin: Secondary | ICD-10-CM | POA: Diagnosis not present

## 2023-05-16 ENCOUNTER — Ambulatory Visit: Payer: BC Managed Care – PPO | Admitting: Nurse Practitioner

## 2023-05-16 ENCOUNTER — Other Ambulatory Visit: Payer: Self-pay

## 2023-05-16 ENCOUNTER — Ambulatory Visit (INDEPENDENT_AMBULATORY_CARE_PROVIDER_SITE_OTHER): Payer: Self-pay | Admitting: Physician Assistant

## 2023-05-16 ENCOUNTER — Encounter: Payer: Self-pay | Admitting: Physician Assistant

## 2023-05-16 VITALS — BP 145/96 | HR 63 | Temp 97.6°F | Ht 62.0 in | Wt 172.0 lb

## 2023-05-16 DIAGNOSIS — R399 Unspecified symptoms and signs involving the genitourinary system: Secondary | ICD-10-CM

## 2023-05-16 DIAGNOSIS — R300 Dysuria: Secondary | ICD-10-CM

## 2023-05-16 DIAGNOSIS — N898 Other specified noninflammatory disorders of vagina: Secondary | ICD-10-CM

## 2023-05-16 DIAGNOSIS — B3731 Acute candidiasis of vulva and vagina: Secondary | ICD-10-CM

## 2023-05-16 LAB — POCT URINALYSIS DIPSTICK (MANUAL)
Nitrite, UA: NEGATIVE
Poct Bilirubin: NEGATIVE
Poct Blood: NEGATIVE
Poct Glucose: NORMAL mg/dL
Poct Ketones: NEGATIVE
Poct Protein: NEGATIVE mg/dL
Poct Urobilinogen: NORMAL mg/dL
Spec Grav, UA: <=1.005 — AB (ref 1.010–1.025)
pH, UA: 7 (ref 5.0–8.0)

## 2023-05-16 MED ORDER — FLUCONAZOLE 150 MG PO TABS: ORAL_TABLET | ORAL | 0 refills | Status: DC

## 2023-05-16 MED ORDER — CEPHALEXIN 500 MG PO CAPS: 500.0000 mg | ORAL_CAPSULE | Freq: Two times a day (BID) | ORAL | 0 refills | Status: AC

## 2023-05-16 NOTE — Progress Notes (Signed)
[facility] [address] [place], [place] [address]   Office Visit Note  Patient Name: Dana Jarvis Date of Birth [id number]  Medical Record number [id number]  Date of Service: 05/16/2023  Chief Complaint  Patient presents with   Acute Visit    Patient c/o discomfort and cracking in her perineal area. Symptoms began last Monday after walking 3 miles and have worsened over the last 2 days. Patient states she had a yeast infection beneath her breasts for months that just cleared up.     HPI Pt is here for a sick visit. States she walked 3 miles 1.5wks ago, and after that she developed some irritation to her perineal area, progressively gotten worse. Feels like a yeast infection, she's had this before. Skin will "tear" and so there's areas that burn. +itchy. Not sure if she also has a UTI. Having urinary frequency, some dysuria, increased urgency. That's been going on since last week. Has a slight amount of discharge, can't really tell the color. No odor.  Tried preparation H which didn't help. Also tried vaseline on the "tears" and that helped some.  No new sexual partners, no concern for STI.  No F/C, CP, SOB, abd pain, n/v/d/c, vaginal bleeding, no genital sores/bumps. No other symptoms.    ROS: Review of Systems  Constitutional:  Negative for chills and fever.  Respiratory:  Negative for shortness of breath.   Cardiovascular:  Negative for chest pain.  Gastrointestinal:  Negative for abdominal pain, constipation, diarrhea, nausea and vomiting.  Genitourinary:  Positive for dysuria, frequency, vaginal discharge and vaginal pain. Negative for genital sores, hematuria, urgency and vaginal bleeding.     Current Medication:  Outpatient Encounter Medications as of 05/16/2023  Medication Sig   cephALEXin (KEFLEX) 500 MG capsule Take 1 capsule (500 mg total) by mouth 2 (two) times daily for 5 days.   cetirizine (ZYRTEC) 10 MG tablet Take 10 mg by mouth daily as needed for  allergies.   fluconazole (DIFLUCAN) 150 MG tablet Take 1 tab PO once, repeat after antibiotics are finished.   No facility-administered encounter medications on file as of 05/16/2023.      Medical History: Past Medical History:  Diagnosis Date   Acute perforated appendicitis 08/20/2022   Allergy    Arthritis    hands, feet   Complication of anesthesia    has awakened during procedures   Headache    sinus   History of hiatal hernia    small   Motion sickness    ocean ships   Seasonal allergies    Wears contact lenses      Vital Signs: BP (!) 145/96   Pulse 63   Temp 97.6 F (36.4 C)   Ht 5\' 2"  (1.575 m)   Wt 78 kg   LMP  (LMP Unknown)   SpO2 97%   BMI 31.46 kg/m    Physical Exam Vitals and nursing note reviewed.  Constitutional:      General: She is not in acute distress.    Appearance: Normal appearance. She is well-developed. She is not toxic-appearing.     Comments: Afebrile, nontoxic, NAD  HENT:     Head: Normocephalic and atraumatic.     Mouth/Throat:     Mouth: Mucous membranes are moist.  Eyes:     General:        Right eye: No discharge.        Left eye: No discharge.     Conjunctiva/sclera: Conjunctivae normal.  Cardiovascular:     Rate and Rhythm: Normal rate.  Pulmonary:     Effort: Pulmonary effort is normal. No respiratory distress.  Abdominal:     General: Bowel sounds are normal. There is no distension.     Palpations: Abdomen is soft.     Tenderness: There is no abdominal tenderness.  Genitourinary:    Comments: Pt declined pelvic exam Musculoskeletal:        General: Normal range of motion.     Cervical back: Normal range of motion and neck supple.  Skin:    General: Skin is warm and dry.     Findings: No rash.  Neurological:     Mental Status: She is alert and oriented to person, place, and time.     Sensory: Sensation is intact. No sensory deficit.     Motor: Motor function is intact.  Psychiatric:        Mood and Affect:  Mood and affect normal.        Behavior: Behavior normal.       Assessment/Plan:   ICD-10-CM   1. Vaginal candidiasis  B37.31     2. Vaginal discharge  N89.8 POCT Urinalysis Dip Manual    3. Dysuria  R30.0      Results for orders placed or performed in visit on 05/16/23 (from the past 24 hours)  POCT Urinalysis Dip Manual     Status: Abnormal   Collection Time: 05/16/23  8:02 AM  Result Value Ref Range   Spec Grav, UA <=1.005 (A) 1.010 - 1.025   pH, UA 7.0 5.0 - 8.0   Leukocytes, UA Trace (A) Negative   Nitrite, UA Negative Negative   Poct Protein Negative Negative, trace mg/dL   Poct Glucose Normal Normal mg/dL   Poct Ketones Negative Negative   Poct Urobilinogen Normal Normal mg/dL   Poct Bilirubin Negative Negative   Poct Blood Negative Negative, trace        -Pt here with yeast infx symptoms and UTI symptoms for about a week, exam reveals no abd tenderness. Declined pelvic exam but based on description, pt likely has yeast infection/irritation of the perineum, will tx as such .   -U/A with +leuks, no nitrites, but given symptoms, will tx for UTI. No UCx sent today -Doubt need for further work up today, doubt need for Upreg/STD testing -Will rx Keflex and Diflucan, taken second dose of diflucan at end of abx regimen.  -Doubt need for IM abx today -Advised use of OTC remedies for symptomatic relief, adequate hydration. Advised no synthetic clothing, staying dry, avoid tight fitting clothing during this time.  -F/up with PCP as needed or if symptoms persist.  -Strict ED/return precautions advised.   General Counseling: [name] verbalizes understanding of the findings of todays visit and agrees with plan of treatment. I have discussed any further diagnostic evaluation that may be needed or ordered today. We also reviewed her medications today. she has been encouraged to call the office with any questions or concerns that should arise related to todays visit.   Orders  Placed This Encounter  Procedures   POCT Urinalysis Dip Manual    Meds ordered this encounter  Medications   fluconazole (DIFLUCAN) 150 MG tablet    Sig: Take 1 tab PO once, repeat after antibiotics are finished.    Dispense:  2 tablet    Refill:  0    Supervising Provider:   Erasmo Downer [[id number]]   cephALEXin (KEFLEX) 500 MG capsule  Sig: Take 1 capsule (500 mg total) by mouth 2 (two) times daily for 5 days.    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

## 2023-05-18 NOTE — Patient Instructions (Incomplete)

## 2023-05-20 ENCOUNTER — Other Ambulatory Visit: Payer: Self-pay

## 2023-05-20 DIAGNOSIS — J301 Allergic rhinitis due to pollen: Secondary | ICD-10-CM

## 2023-05-20 DIAGNOSIS — Z0000 Encounter for general adult medical examination without abnormal findings: Secondary | ICD-10-CM

## 2023-05-20 DIAGNOSIS — E7800 Pure hypercholesterolemia, unspecified: Secondary | ICD-10-CM

## 2023-05-22 ENCOUNTER — Ambulatory Visit: Payer: BC Managed Care – PPO | Admitting: Nurse Practitioner

## 2023-05-22 DIAGNOSIS — R399 Unspecified symptoms and signs involving the genitourinary system: Secondary | ICD-10-CM

## 2023-05-23 ENCOUNTER — Other Ambulatory Visit: Payer: Self-pay

## 2023-09-29 ENCOUNTER — Other Ambulatory Visit: Payer: Self-pay | Admitting: Nurse Practitioner

## 2023-09-29 DIAGNOSIS — Z1231 Encounter for screening mammogram for malignant neoplasm of breast: Secondary | ICD-10-CM

## 2023-10-30 ENCOUNTER — Other Ambulatory Visit: Payer: Self-pay

## 2023-11-04 ENCOUNTER — Other Ambulatory Visit (INDEPENDENT_AMBULATORY_CARE_PROVIDER_SITE_OTHER): Payer: Self-pay

## 2023-11-04 DIAGNOSIS — E7800 Pure hypercholesterolemia, unspecified: Secondary | ICD-10-CM

## 2023-11-04 DIAGNOSIS — J301 Allergic rhinitis due to pollen: Secondary | ICD-10-CM

## 2023-11-04 DIAGNOSIS — Z0000 Encounter for general adult medical examination without abnormal findings: Secondary | ICD-10-CM

## 2023-11-04 NOTE — Progress Notes (Signed)
 Nurse visit for lab draw. Did not see provider today.

## 2023-11-05 ENCOUNTER — Ambulatory Visit: Payer: Self-pay | Admitting: Nurse Practitioner

## 2023-11-05 LAB — CBC WITH DIFFERENTIAL/PLATELET
Basophils Absolute: 0.1 x10E3/uL (ref 0.0–0.2)
Basos: 1 %
EOS (ABSOLUTE): 0.2 x10E3/uL (ref 0.0–0.4)
Eos: 4 %
Hematocrit: 42 % (ref 34.0–46.6)
Hemoglobin: 14.4 g/dL (ref 11.1–15.9)
Immature Grans (Abs): 0 x10E3/uL (ref 0.0–0.1)
Immature Granulocytes: 0 %
Lymphocytes Absolute: 1.8 x10E3/uL (ref 0.7–3.1)
Lymphs: 27 %
MCH: 34.2 pg — ABNORMAL HIGH (ref 26.6–33.0)
MCHC: 34.3 g/dL (ref 31.5–35.7)
MCV: 100 fL — ABNORMAL HIGH (ref 79–97)
Monocytes Absolute: 0.6 x10E3/uL (ref 0.1–0.9)
Monocytes: 9 %
Neutrophils Absolute: 3.9 x10E3/uL (ref 1.4–7.0)
Neutrophils: 59 %
Platelets: 309 x10E3/uL (ref 150–450)
RBC: 4.21 x10E6/uL (ref 3.77–5.28)
RDW: 11.5 % — ABNORMAL LOW (ref 11.7–15.4)
WBC: 6.6 x10E3/uL (ref 3.4–10.8)

## 2023-11-05 LAB — COMPREHENSIVE METABOLIC PANEL WITH GFR
ALT: 13 IU/L (ref 0–32)
AST: 16 IU/L (ref 0–40)
Albumin: 4.2 g/dL (ref 3.8–4.9)
Alkaline Phosphatase: 87 IU/L (ref 44–121)
BUN/Creatinine Ratio: 21 (ref 9–23)
BUN: 12 mg/dL (ref 6–24)
Bilirubin Total: 0.5 mg/dL (ref 0.0–1.2)
CO2: 20 mmol/L (ref 20–29)
Calcium: 9 mg/dL (ref 8.7–10.2)
Chloride: 104 mmol/L (ref 96–106)
Creatinine, Ser: 0.57 mg/dL (ref 0.57–1.00)
Globulin, Total: 2.2 g/dL (ref 1.5–4.5)
Glucose: 99 mg/dL (ref 70–99)
Potassium: 4.2 mmol/L (ref 3.5–5.2)
Sodium: 139 mmol/L (ref 134–144)
Total Protein: 6.4 g/dL (ref 6.0–8.5)
eGFR: 105 mL/min/1.73 (ref >59)

## 2023-11-05 LAB — LIPID PANEL W/O CHOL/HDL RATIO
Cholesterol, Total: 207 mg/dL — ABNORMAL HIGH (ref 100–199)
HDL: 100 mg/dL (ref >39)
LDL Chol Calc (NIH): 94 mg/dL (ref 0–99)
Triglycerides: 71 mg/dL (ref 0–149)
VLDL Cholesterol Cal: 13 mg/dL (ref 5–40)

## 2023-11-05 LAB — TSH: TSH: 2.27 u[IU]/mL (ref 0.450–4.500)

## 2023-11-05 NOTE — Progress Notes (Signed)
 Contacted via MyChart  Good morning Dana Jarvis, your labs have returned and overall are stable. Hemoglobin and hematocrit have improved to normal range. Lipid panel looks better this check, continue to focus on healthy diet changes and regular activity.  Overall good labs. Any questions?  Keep being stellar!!  Thank you for allowing me to participate in your care.  I appreciate you. Kindest regards, Safiyya Stokes

## 2023-11-13 ENCOUNTER — Ambulatory Visit
Admission: RE | Admit: 2023-11-13 | Discharge: 2023-11-13 | Disposition: A | Discharge status: Home / Self Care | Source: Ambulatory Visit | Attending: Nurse Practitioner | Admitting: Nurse Practitioner

## 2023-11-13 ENCOUNTER — Other Ambulatory Visit: Payer: Self-pay | Admitting: Nurse Practitioner

## 2023-11-13 DIAGNOSIS — N650 Deformity of reconstructed breast: Secondary | ICD-10-CM

## 2023-11-13 DIAGNOSIS — Z1231 Encounter for screening mammogram for malignant neoplasm of breast: Secondary | ICD-10-CM | POA: Insufficient documentation

## 2023-11-17 ENCOUNTER — Ambulatory Visit
Admission: RE | Admit: 2023-11-17 | Discharge: 2023-11-17 | Disposition: A | Discharge status: Home / Self Care | Source: Ambulatory Visit | Attending: Nurse Practitioner | Admitting: Nurse Practitioner

## 2023-11-17 DIAGNOSIS — N650 Deformity of reconstructed breast: Secondary | ICD-10-CM | POA: Insufficient documentation

## 2023-11-17 DIAGNOSIS — R92323 Mammographic fibroglandular density, bilateral breasts: Secondary | ICD-10-CM | POA: Diagnosis not present

## 2023-11-17 DIAGNOSIS — R928 Other abnormal and inconclusive findings on diagnostic imaging of breast: Secondary | ICD-10-CM | POA: Diagnosis not present

## 2023-12-19 ENCOUNTER — Encounter: Payer: Self-pay | Admitting: Nurse Practitioner

## 2024-01-18 NOTE — Patient Instructions (Signed)
 Be Involved in Caring For Your Health:  Taking Medications When medications are taken as directed, they can greatly improve your health. But if they are not taken as prescribed, they may not work. In some cases, not taking them correctly can be harmful. To help ensure your treatment remains effective and safe, understand your medications and how to take them. Bring your medications to each visit for review by your provider.  Your lab results, notes, and after visit summary will be available on My Chart. We strongly encourage you to use this feature. If lab results are abnormal the clinic will contact you with the appropriate steps. If the clinic does not contact you assume the results are satisfactory. You can always view your results on My Chart. If you have questions regarding your health or results, please contact the clinic during office hours. You can also ask questions on My Chart.  We at [facility] are grateful that you chose us  to provide your care. We strive to provide evidence-based and compassionate care and are always looking for feedback. If you get a survey from the clinic please complete this so we can hear your opinions.  Healthy Eating, Adult Healthy eating may help you get and keep a healthy body weight, reduce the risk of chronic disease, and live a long and productive life. It is important to follow a healthy eating pattern. Your nutritional and calorie needs should be met mainly by different nutrient-rich foods. What are tips for following this plan? Reading food labels Read labels and choose the following: Reduced or low sodium products. Juices with 100% fruit juice. Foods with low saturated fats (<3 g per serving) and high polyunsaturated and monounsaturated fats. Foods with whole grains, such as whole wheat, cracked wheat, brown rice, and wild rice. Whole grains that are fortified with folic acid. This is recommended for females who are pregnant or who want to  become pregnant. Read labels and do not eat or drink the following: Foods or drinks with added sugars. These include foods that contain brown sugar, corn sweetener, corn syrup, dextrose , fructose, glucose, high-fructose corn syrup, honey, invert sugar, lactose, malt syrup, maltose, molasses, raw sugar, sucrose, trehalose, or turbinado sugar. Limit your intake of added sugars to less than 10% of your total daily calories. Do not eat more than the following amounts of added sugar per day: 6 teaspoons (25 g) for females. 9 teaspoons (38 g) for males. Foods that contain processed or refined starches and grains. Refined grain products, such as white flour, degermed cornmeal, white bread, and white rice. Shopping Choose nutrient-rich snacks, such as vegetables, whole fruits, and nuts. Avoid high-calorie and high-sugar snacks, such as potato chips, fruit snacks, and candy. Use oil-based dressings and spreads on foods instead of solid fats such as butter, margarine, sour cream, or cream cheese. Limit pre-made sauces, mixes, and instant products such as flavored rice, instant noodles, and ready-made pasta. Try more plant-protein sources, such as tofu, tempeh, black beans, edamame, lentils, nuts, and seeds. Explore eating plans such as the Mediterranean diet or vegetarian diet. Try heart-healthy dips made with beans and healthy fats like hummus and guacamole. Vegetables go great with these. Cooking Use oil to saut or stir-fry foods instead of solid fats such as butter, margarine, or lard. Try baking, boiling, grilling, or broiling instead of frying. Remove the fatty part of meats before cooking. Steam vegetables in water  or broth. Meal planning  At meals, imagine dividing your plate into fourths: One-half of  your plate is fruits and vegetables. One-fourth of your plate is whole grains. One-fourth of your plate is protein, especially lean meats, poultry, eggs, tofu, beans, or nuts. Include low-fat  dairy as part of your daily diet. Lifestyle Choose healthy options in all settings, including home, work, school, restaurants, or stores. Prepare your food safely: Wash your hands after handling raw meats. Where you prepare food, keep surfaces clean by regularly washing with hot, soapy water . Keep raw meats separate from ready-to-eat foods, such as fruits and vegetables. Cook seafood, meat, poultry, and eggs to the recommended temperature. Get a food thermometer. Store foods at safe temperatures. In general: Keep cold foods at 84F (4.4C) or below. Keep hot foods at 184F (60C) or above. Keep your freezer at [facility] (-17.8C) or below. Foods are not safe to eat if they have been between the temperatures of 40-184F (4.4-60C) for more than 2 hours. What foods should I eat? Fruits Aim to eat 1-2 cups of fresh, canned (in natural juice), or frozen fruits each day. One cup of fruit equals 1 small apple, 1 large banana, 8 large strawberries, 1 cup (237 g) canned fruit,  cup (82 g) dried fruit, or 1 cup (240 mL) 100% juice. Vegetables Aim to eat 2-4 cups of fresh and frozen vegetables each day, including different varieties and colors. One cup of vegetables equals 1 cup (91 g) broccoli or cauliflower florets, 2 medium carrots, 2 cups (150 g) raw, leafy greens, 1 large tomato, 1 large bell pepper, 1 large sweet potato, or 1 medium white potato. Grains Aim to eat 5-10 ounce-equivalents of whole grains each day. Examples of 1 ounce-equivalent of grains include 1 slice of bread, 1 cup (40 g) ready-to-eat cereal, 3 cups (24 g) popcorn, or  cup (93 g) cooked rice. Meats and other proteins Try to eat 5-7 ounce-equivalents of protein each day. Examples of 1 ounce-equivalent of protein include 1 egg,  oz nuts (12 almonds, 24 pistachios, or 7 walnut halves), 1/4 cup (90 g) cooked beans, 6 tablespoons (90 g) hummus or 1 tablespoon (16 g) peanut butter. A cut of meat or fish that is the size of a deck of  cards is about 3-4 ounce-equivalents (85 g). Of the protein you eat each week, try to have at least 8 sounce (227 g) of seafood. This is about 2 servings per week. This includes salmon, trout, herring, sardines, and anchovies. Dairy Aim to eat 3 cup-equivalents of fat-free or low-fat dairy each day. Examples of 1 cup-equivalent of dairy include 1 cup (240 mL) milk, 8 ounces (250 g) yogurt, 1 ounces (44 g) natural cheese, or 1 cup (240 mL) fortified soy milk. Fats and oils Aim for about 5 teaspoons (21 g) of fats and oils per day. Choose monounsaturated fats, such as canola and olive oils, mayonnaise made with olive oil or avocado oil, avocados, peanut butter, and most nuts, or polyunsaturated fats, such as sunflower, corn, and soybean oils, walnuts, pine nuts, sesame seeds, sunflower seeds, and flaxseed. Beverages Aim for 6 eight-ounce glasses of water  per day. Limit coffee to 3-5 eight-ounce cups per day. Limit caffeinated beverages that have added calories, such as soda and energy drinks. If you drink alcohol: Limit how much you have to: 0-1 drink a day if you are female. 0-2 drinks a day if you are female. Know how much alcohol is in your drink. In the [place] one drink is one 12 oz bottle of beer (355 mL), one 5 oz glass of wine (  148 mL), or one 1 oz glass of hard liquor (44 mL). Seasoning and other foods Try not to add too much salt to your food. Try using herbs and spices instead of salt. Try not to add sugar to food. This information is based on [place] nutrition guidelines. To learn more, visit [employer]. Exact amounts may vary. You may need different amounts. This information is not intended to replace advice given to you by your health care provider. Make sure you discuss any questions you have with your health care provider. Document Revised: 12/17/2021 Document Reviewed: 12/17/2021 Elsevier Patient Education  2024 [employer].

## 2024-01-21 ENCOUNTER — Ambulatory Visit (INDEPENDENT_AMBULATORY_CARE_PROVIDER_SITE_OTHER): Admitting: Nurse Practitioner

## 2024-01-21 ENCOUNTER — Encounter: Payer: Self-pay | Admitting: Nurse Practitioner

## 2024-01-21 VITALS — BP 124/84 | HR 63 | Temp 98.2°F | Resp 14 | Ht 62.01 in | Wt 174.8 lb

## 2024-01-21 DIAGNOSIS — Z803 Family history of malignant neoplasm of breast: Secondary | ICD-10-CM | POA: Diagnosis not present

## 2024-01-21 DIAGNOSIS — Z0000 Encounter for general adult medical examination without abnormal findings: Secondary | ICD-10-CM

## 2024-01-21 DIAGNOSIS — Z23 Encounter for immunization: Secondary | ICD-10-CM | POA: Diagnosis not present

## 2024-01-21 DIAGNOSIS — Z789 Other specified health status: Secondary | ICD-10-CM

## 2024-01-21 DIAGNOSIS — E7800 Pure hypercholesterolemia, unspecified: Secondary | ICD-10-CM | POA: Diagnosis not present

## 2024-01-21 DIAGNOSIS — J301 Allergic rhinitis due to pollen: Secondary | ICD-10-CM

## 2024-01-21 NOTE — Assessment & Plan Note (Signed)
Both on maternal and paternal sides -- cousins, aunt, and mother.  Recommend annual mammogram and discussed referral to oncology for genetic testing, she wishes to think about this.

## 2024-01-21 NOTE — Assessment & Plan Note (Signed)
Chronic, ongoing.  Continue Zyrtec and Flonase at this time.  May benefit from allergy testing in future.

## 2024-01-21 NOTE — Progress Notes (Signed)
 BP 124/84 (BP Location: Left Arm, Patient Position: Sitting, Cuff Size: Large)   Pulse 63   Temp 98.2 F (36.8 C) (Oral)   Resp 14   Ht 5' 2.01 (1.575 m)   Wt 174 lb 12.8 oz (79.3 kg)   LMP  (LMP Unknown)   SpO2 99%   BMI 31.96 kg/m    Subjective:    Patient ID: Dana Jarvis, female    DOB: Apr 08, 1965, 58 y.o.   MRN: [id number]  HPI: Dana Jarvis is a 58 y.o. female presenting on 01/21/2024 for comprehensive medical examination. Current medical complaints include:none  She currently lives with: self Menopausal Symptoms: no  Continues to take Zyrtec and local pollen for allergies. Labs done in August 2025.  The 10-year ASCVD risk score (Arnett DK, et al., 2019) is: 1.9%   Values used to calculate the score:     Age: 16 years     Clincally relevant sex: Female     Is Non-Hispanic African American: No     Diabetic: No     Tobacco smoker: No     Systolic Blood Pressure: 124 mmHg     Is BP treated: No     HDL Cholesterol: 100 mg/dL     Total Cholesterol: 207 mg/dL  Depression Screen done today and results listed below:     01/21/2024    8:12 AM 12/18/2022    1:49 PM 09/20/2021    8:42 AM 10/11/2020    8:17 AM 10/04/2020   10:03 AM  Depression screen PHQ 2/9  Decreased Interest 0 0 0 0 0  Down, Depressed, Hopeless 0 0 0 0 0  PHQ - 2 Score 0 0 0 0 0  Altered sleeping 0 0 0    Tired, decreased energy 0 0 0    Change in appetite 0 0 0    Feeling bad or failure about yourself  0 0 0    Trouble concentrating 0 0 0    Moving slowly or fidgety/restless 0 0 0    Suicidal thoughts 0 0 0    PHQ-9 Score 0 0 0    Difficult doing work/chores  Not difficult at all Not difficult at all        01/21/2024    8:12 AM 12/18/2022    1:50 PM 09/20/2021    8:43 AM  GAD 7 : Generalized Anxiety Score  Nervous, Anxious, on Edge 0 0 0  Control/stop worrying 0 0 0  Worry too much - different things 0 0 0  Trouble relaxing 0 0 0  Restless 0 0 0  Easily annoyed or irritable 0 0 0   Afraid - awful might happen 0 0 0  Total GAD 7 Score 0 0 0  Anxiety Difficulty  Not difficult at all Not difficult at all      10/04/2020   10:03 AM 10/11/2020    8:17 AM 09/20/2021    8:41 AM 12/18/2022    1:49 PM 01/21/2024    8:11 AM  Fall Risk  Falls in the past year? 0 0 0 0 0  Was there an injury with Fall? 0 0 0 0 0  Fall Risk Category Calculator 0 0 0 0 0  Fall Risk Category (Retired) Low  Low  Low     (RETIRED) Patient Fall Risk Level   Low fall risk     Patient at Risk for Falls Due to No Fall Risks No Fall Risks No Fall Risks No  Fall Risks No Fall Risks  Fall risk Follow up Falls evaluation completed  Falls evaluation completed  Falls evaluation completed  Falls evaluation completed Falls evaluation completed     Data saved with a previous flowsheet row definition    Functional Status Survey: Is the patient deaf or have difficulty hearing?: No Does the patient have difficulty seeing, even when wearing glasses/contacts?: No Does the patient have difficulty concentrating, remembering, or making decisions?: No Does the patient have difficulty walking or climbing stairs?: No Does the patient have difficulty dressing or bathing?: No Does the patient have difficulty doing errands alone such as visiting a doctor's office or shopping?: No   Past Medical History:  Past Medical History:  Diagnosis Date   Acute perforated appendicitis 08/20/2022   Allergy    Arthritis    hands, feet   Complication of anesthesia    has awakened during procedures   Headache    sinus   History of hiatal hernia    small   Motion sickness    ocean ships   Seasonal allergies    Wears contact lenses     Surgical History:  Past Surgical History:  Procedure Laterality Date   ABDOMINAL HYSTERECTOMY     APPENDECTOMY  July 2024   BREAST BIOPSY Right 05/19/2020   Stereo Bx, PASH/Radial scar   BREAST BIOPSY WITH RADIO FREQUENCY LOCALIZER Right 06/21/2020   Procedure: EXCISION OF BREAST BIOPSY  with Radiofrequency Tag;  Surgeon: Rodolph Romano, MD;  Location: [facility];  Service: General;  Laterality: Right;   BREAST EXCISIONAL BIOPSY Right 06/21/2020   neg   CESAREAN SECTION  1988   COLONOSCOPY WITH PROPOFOL  N/A 12/31/2016   Procedure: COLONOSCOPY WITH PROPOFOL ;  Surgeon: Jinny Carmine, MD;  Location: [facility];  Service: Endoscopy;  Laterality: N/A;   FRACTURE SURGERY Right 2007   ankle   HARDWARE REMOVAL Right 2018   Ankle   LAPAROSCOPIC APPENDECTOMY N/A 10/17/2022   Procedure: APPENDECTOMY LAPAROSCOPIC;  Surgeon: Jordis Laneta FALCON, MD;  Location: [facility];  Service: General;  Laterality: N/A;   TONSILLECTOMY  1972   TOTAL ABDOMINAL HYSTERECTOMY W/ BILATERAL SALPINGOOPHORECTOMY  2005   TOTAL ABDOMINAL HYSTERECTOMY W/ BILATERAL SALPINGOOPHORECTOMY  2005   WRIST GANGLION EXCISION Left 2002    Medications:  Current Outpatient Medications on File Prior to Visit  Medication Sig   cetirizine (ZYRTEC) 10 MG tablet Take 10 mg by mouth daily as needed for allergies.   No current facility-administered medications on file prior to visit.    Allergies:  Allergies  Allergen Reactions   Chlorhexidine Gluconate Rash   Latex Rash   Other Rash    Chloraswab   Sulfa Antibiotics Rash    Social History:  Social History   Socioeconomic History   Marital status: Widowed    Spouse name: Not on file   Number of children: 1   Years of education: Not on file   Highest education level: Some college, no degree  Occupational History   Not on file  Tobacco Use   Smoking status: Never   Smokeless tobacco: Never  Vaping Use   Vaping status: Never Used  Substance and Sexual Activity   Alcohol use: Yes    Alcohol/week: 4.0 standard drinks of alcohol    Types: 4 Glasses of wine per week   Drug use: No   Sexual activity: Yes    Birth control/protection: Post-menopausal  Other Topics Concern   Not on file  Social History Narrative   Not  on file   Social Drivers of  Health   Financial Resource Strain: Low Risk  (01/20/2024)   Overall Financial Resource Strain (CARDIA)    Difficulty of Paying Living Expenses: Not hard at all  Food Insecurity: No Food Insecurity (01/20/2024)   Hunger Vital Sign    Worried About Running Out of Food in the Last Year: Never true    Ran Out of Food in the Last Year: Never true  Transportation Needs: No Transportation Needs (01/20/2024)   PRAPARE - Administrator, Civil Service (Medical): No    Lack of Transportation (Non-Medical): No  Physical Activity: Insufficiently Active (01/20/2024)   Exercise Vital Sign    Days of Exercise per Week: 3 days    Minutes of Exercise per Session: 20 min  Stress: No Stress Concern Present (01/20/2024)   [employer] of Occupational Health - Occupational Stress Questionnaire    Feeling of Stress: Only a little  Social Connections: Moderately Integrated (01/20/2024)   Social Connection and Isolation Panel    Frequency of Communication with Friends and Family: More than three times a week    Frequency of Social Gatherings with Friends and Family: More than three times a week    Attends Religious Services: 1 to 4 times per year    Active Member of [employer] or Organizations: No    Attends Engineer, structural: Not on file    Marital Status: Living with partner  Intimate Partner Violence: Not At Risk (08/21/2022)   Humiliation, Afraid, Rape, and Kick questionnaire    Fear of Current or Ex-Partner: No    Emotionally Abused: No    Physically Abused: No    Sexually Abused: No   Social History   Tobacco Use  Smoking Status Never  Smokeless Tobacco Never   Social History   Substance and Sexual Activity  Alcohol Use Yes   Alcohol/week: 4.0 standard drinks of alcohol   Types: 4 Glasses of wine per week    Family History:  Family History  Problem Relation Age of Onset   Arthritis Mother    Cancer Mother        breast   Glaucoma Mother    Breast cancer  Mother 10   Cancer Father        lung   Hypertension Sister    Hypertension Brother    Breast cancer Maternal Aunt    Breast cancer Cousin    Breast cancer Cousin    Colon cancer Neg Hx    Ovarian cancer Neg Hx     Past medical history, surgical history, medications, allergies, family history and social history reviewed with patient today and changes made to appropriate areas of the chart.   ROS All other ROS negative except what is listed above and in the HPI.      Objective:    BP 124/84 (BP Location: Left Arm, Patient Position: Sitting, Cuff Size: Large)   Pulse 63   Temp 98.2 F (36.8 C) (Oral)   Resp 14   Ht 5' 2.01 (1.575 m)   Wt 174 lb 12.8 oz (79.3 kg)   LMP  (LMP Unknown)   SpO2 99%   BMI 31.96 kg/m   Wt Readings from Last 3 Encounters:  01/21/24 174 lb 12.8 oz (79.3 kg)  05/16/23 172 lb (78 kg)  12/18/22 172 lb (78 kg)    Physical Exam Vitals and nursing note reviewed. Exam conducted with a chaperone present.  Constitutional:  General: She is awake. She is not in acute distress.    Appearance: She is well-developed and well-groomed. She is not ill-appearing or toxic-appearing.  HENT:     Head: Normocephalic and atraumatic.     Right Ear: Hearing, tympanic membrane, ear canal and external ear normal. No drainage.     Left Ear: Hearing, tympanic membrane, ear canal and external ear normal. No drainage.     Nose: Nose normal.     Right Sinus: No maxillary sinus tenderness or frontal sinus tenderness.     Left Sinus: No maxillary sinus tenderness or frontal sinus tenderness.     Mouth/Throat:     Mouth: Mucous membranes are moist.     Pharynx: Oropharynx is clear. Uvula midline. No pharyngeal swelling, oropharyngeal exudate or posterior oropharyngeal erythema.  Eyes:     General: Lids are normal.        Right eye: No discharge.        Left eye: No discharge.     Extraocular Movements: Extraocular movements intact.     Conjunctiva/sclera: Conjunctivae  normal.     Pupils: Pupils are equal, round, and reactive to light.     Visual Fields: Right eye visual fields normal and left eye visual fields normal.  Neck:     Thyroid: No thyromegaly.     Vascular: No carotid bruit.     Trachea: Trachea normal.  Cardiovascular:     Rate and Rhythm: Normal rate and regular rhythm.     Heart sounds: Normal heart sounds. No murmur heard.    No gallop.  Pulmonary:     Effort: Pulmonary effort is normal. No accessory muscle usage or respiratory distress.     Breath sounds: Normal breath sounds.  Chest:  Breasts:    Right: Normal.     Left: Normal.  Abdominal:     General: Bowel sounds are normal.     Palpations: Abdomen is soft. There is no hepatomegaly or splenomegaly.     Tenderness: There is no abdominal tenderness.  Musculoskeletal:        General: Normal range of motion.     Cervical back: Normal range of motion and neck supple.     Right lower leg: No edema.     Left lower leg: No edema.  Lymphadenopathy:     Head:     Right side of head: No submental, submandibular, tonsillar, preauricular or posterior auricular adenopathy.     Left side of head: No submental, submandibular, tonsillar, preauricular or posterior auricular adenopathy.     Cervical: No cervical adenopathy.     Upper Body:     Right upper body: No supraclavicular, axillary or pectoral adenopathy.     Left upper body: No supraclavicular, axillary or pectoral adenopathy.  Skin:    General: Skin is warm and dry.     Capillary Refill: Capillary refill takes less than 2 seconds.     Findings: No rash.  Neurological:     Mental Status: She is alert and oriented to person, place, and time.     Gait: Gait is intact.     Deep Tendon Reflexes: Reflexes are normal and symmetric.     Reflex Scores:      Brachioradialis reflexes are 2+ on the right side and 2+ on the left side.      Patellar reflexes are 2+ on the right side and 2+ on the left side. Psychiatric:        Attention  and Perception: Attention  normal.        Mood and Affect: Mood normal.        Speech: Speech normal.        Behavior: Behavior normal. Behavior is cooperative.        Thought Content: Thought content normal.        Judgment: Judgment normal.    Results for orders placed or performed in visit on 11/04/23  TSH   Collection Time: 11/04/23  7:42 AM  Result Value Ref Range   TSH 2.270 0.450 - 4.500 uIU/mL  CBC with Differential/Platelet   Collection Time: 11/04/23  7:42 AM  Result Value Ref Range   WBC 6.6 3.4 - 10.8 x10E3/uL   RBC 4.21 3.77 - 5.28 x10E6/uL   Hemoglobin 14.4 11.1 - 15.9 g/dL   Hematocrit 57.9 65.9 - 46.6 %   MCV 100 (H) 79 - 97 fL   MCH 34.2 (H) 26.6 - 33.0 pg   MCHC 34.3 31.5 - 35.7 g/dL   RDW 88.4 (L) 88.2 - 84.5 %   Platelets 309 150 - 450 x10E3/uL   Neutrophils 59 Not Estab. %   Lymphs 27 Not Estab. %   Monocytes 9 Not Estab. %   Eos 4 Not Estab. %   Basos 1 Not Estab. %   Neutrophils Absolute 3.9 1.4 - 7.0 x10E3/uL   Lymphocytes Absolute 1.8 0.7 - 3.1 x10E3/uL   Monocytes Absolute 0.6 0.1 - 0.9 x10E3/uL   EOS (ABSOLUTE) 0.2 0.0 - 0.4 x10E3/uL   Basophils Absolute 0.1 0.0 - 0.2 x10E3/uL   Immature Granulocytes 0 Not Estab. %   Immature Grans (Abs) 0.0 0.0 - 0.1 x10E3/uL  Comprehensive metabolic panel   Collection Time: 11/04/23  7:42 AM  Result Value Ref Range   Glucose 99 70 - 99 mg/dL   BUN 12 6 - 24 mg/dL   Creatinine, Ser 9.42 0.57 - 1.00 mg/dL   eGFR 894 >40 fO/fpw/8.26   BUN/Creatinine Ratio 21 9 - 23   Sodium 139 134 - 144 mmol/L   Potassium 4.2 3.5 - 5.2 mmol/L   Chloride 104 96 - 106 mmol/L   CO2 20 20 - 29 mmol/L   Calcium 9.0 8.7 - 10.2 mg/dL   Total Protein 6.4 6.0 - 8.5 g/dL   Albumin 4.2 3.8 - 4.9 g/dL   Globulin, Total 2.2 1.5 - 4.5 g/dL   Bilirubin Total 0.5 0.0 - 1.2 mg/dL   Alkaline Phosphatase 87 44 - 121 IU/L   AST 16 0 - 40 IU/L   ALT 13 0 - 32 IU/L  Lipid Panel w/o Chol/HDL Ratio   Collection Time: 11/04/23  7:42 AM   Result Value Ref Range   Cholesterol, Total 207 (H) 100 - 199 mg/dL   Triglycerides 71 0 - 149 mg/dL   HDL 899 >60 mg/dL   VLDL Cholesterol Cal 13 5 - 40 mg/dL   LDL Chol Calc (NIH) 94 0 - 99 mg/dL      Assessment & Plan:   Problem List Items Addressed This Visit       Respiratory   Allergic rhinitis   Chronic, ongoing.  Continue Zyrtec and Flonase  at this time.  May benefit from allergy testing in future.        Other   Family history of breast cancer   Both on maternal and paternal sides -- cousins, aunt, and mother.  Recommend annual mammogram and discussed referral to oncology for genetic testing, she wishes to think about this.  Elevated LDL cholesterol level - Primary   Noted on past labs, no current medications.  Plan to recheck lipid panel and start medication as needed.      Other Visit Diagnoses       Flu vaccine need       Flu vaccine today, educated patient.     Encounter for annual physical exam       Annual physical today with labs and health maintenance reviewed, discussed with patient.       Follow up plan: Return in about 1 year (around 01/20/2025) for Annual Physical.   LABORATORY TESTING:  - Pap smear: had total hysterectomy  IMMUNIZATIONS:   - Tdap: Tetanus vaccination status reviewed: last tetanus booster within 10 years. - Influenza: obtains at [facility] next week - Pneumovax: Not applicable - Prevnar: Not applicable - COVID: Up to date x 2 - HPV: Not applicable - Shingrix vaccine: Refused  SCREENING: -Mammogram: Up to date performed last 11/17/23 - Colonoscopy: Up to date due next 01/01/2027 - Bone Density: Not applicable  -Hearing Test: Not applicable  -Spirometry: Not applicable   PATIENT COUNSELING:   Advised to take 1 mg of folate supplement per day if capable of pregnancy.   Sexuality: Discussed sexually transmitted diseases, partner selection, use of condoms, avoidance of unintended pregnancy  and contraceptive alternatives.    Advised to avoid cigarette smoking.  I discussed with the patient that most people either abstain from alcohol or drink within safe limits (<=14/week and <=4 drinks/occasion for males, <=7/weeks and <= 3 drinks/occasion for females) and that the risk for alcohol disorders and other health effects rises proportionally with the number of drinks per week and how often a drinker exceeds daily limits.  Discussed cessation/primary prevention of drug use and availability of treatment for abuse.   Diet: Encouraged to adjust caloric intake to maintain  or achieve ideal body weight, to reduce intake of dietary saturated fat and total fat, to limit sodium intake by avoiding high sodium foods and not adding table salt, and to maintain adequate dietary potassium and calcium preferably from fresh fruits, vegetables, and low-fat dairy products.    Stressed the importance of regular exercise  Injury prevention: Discussed safety belts, safety helmets, smoke detector, smoking near bedding or upholstery.   Dental health: Discussed importance of regular tooth brushing, flossing, and dental visits.    NEXT PREVENTATIVE PHYSICAL DUE IN 1 YEAR. Return in about 1 year (around 01/20/2025) for Annual Physical.

## 2024-01-21 NOTE — Assessment & Plan Note (Signed)
Noted on past labs, no current medications.  Plan to recheck lipid panel and start medication as needed.

## 2024-01-29 ENCOUNTER — Ambulatory Visit: Payer: Self-pay

## 2025-01-24 ENCOUNTER — Encounter: Admitting: Nurse Practitioner
